# Patient Record
Sex: Female | Born: 1960 | Race: White | Hispanic: No | Marital: Married | Smoking: Former smoker
Health system: Southern US, Community
[De-identification: ages and names within clinical notes are randomized; demographics above are authoritative.]

## PROBLEM LIST (undated history)

## (undated) DIAGNOSIS — K5792 Diverticulitis of intestine, part unspecified, without perforation or abscess without bleeding: Secondary | ICD-10-CM

## (undated) DIAGNOSIS — O24419 Gestational diabetes mellitus in pregnancy, unspecified control: Secondary | ICD-10-CM

## (undated) DIAGNOSIS — I10 Essential (primary) hypertension: Secondary | ICD-10-CM

## (undated) DIAGNOSIS — E785 Hyperlipidemia, unspecified: Secondary | ICD-10-CM

## (undated) DIAGNOSIS — N200 Calculus of kidney: Secondary | ICD-10-CM

## (undated) DIAGNOSIS — B019 Varicella without complication: Secondary | ICD-10-CM

## (undated) DIAGNOSIS — K5790 Diverticulosis of intestine, part unspecified, without perforation or abscess without bleeding: Secondary | ICD-10-CM

## (undated) DIAGNOSIS — I519 Heart disease, unspecified: Secondary | ICD-10-CM

## (undated) DIAGNOSIS — K8020 Calculus of gallbladder without cholecystitis without obstruction: Secondary | ICD-10-CM

## (undated) HISTORY — DX: Heart disease, unspecified: I51.9

## (undated) HISTORY — DX: Diverticulosis of intestine, part unspecified, without perforation or abscess without bleeding: K57.90

## (undated) HISTORY — DX: Essential (primary) hypertension: I10

## (undated) HISTORY — PX: CHOLECYSTECTOMY: SHX55

## (undated) HISTORY — PX: OTHER SURGICAL HISTORY: SHX169

## (undated) HISTORY — DX: Calculus of kidney: N20.0

## (undated) HISTORY — PX: CORONARY ARTERY BYPASS GRAFT: SHX141

## (undated) HISTORY — DX: Hyperlipidemia, unspecified: E78.5

## (undated) HISTORY — DX: Varicella without complication: B01.9

## (undated) HISTORY — DX: Diverticulitis of intestine, part unspecified, without perforation or abscess without bleeding: K57.92

## (undated) HISTORY — PX: GASTRIC BYPASS: SHX52

## (undated) HISTORY — DX: Calculus of gallbladder without cholecystitis without obstruction: K80.20

## (undated) HISTORY — DX: Gestational diabetes mellitus in pregnancy, unspecified control: O24.419

---

## 2003-06-22 HISTORY — PX: GASTRIC BYPASS: SHX52

## 2017-06-21 HISTORY — PX: CORONARY ARTERY BYPASS GRAFT: SHX141

## 2018-05-10 DIAGNOSIS — I10 Essential (primary) hypertension: Secondary | ICD-10-CM | POA: Insufficient documentation

## 2018-05-10 DIAGNOSIS — D6851 Activated protein C resistance: Secondary | ICD-10-CM | POA: Insufficient documentation

## 2018-06-19 DIAGNOSIS — E785 Hyperlipidemia, unspecified: Secondary | ICD-10-CM | POA: Insufficient documentation

## 2020-02-01 LAB — HM COLONOSCOPY

## 2020-10-23 ENCOUNTER — Ambulatory Visit: Payer: 59

## 2020-10-23 ENCOUNTER — Ambulatory Visit: Payer: 59 | Attending: Internal Medicine

## 2020-10-23 NOTE — Progress Notes (Signed)
   Covid-19 Vaccination Clinic  Name:  Tricia King    MRN: [id number] DOB: 18-Sep-1960  10/23/2020  Tricia King was observed post Covid-19 immunization for 30 minutes based on pre-vaccination screening without incident. She was provided with Vaccine Information Sheet and instruction to access the V-Safe system.   Tricia King was instructed to call 911 with any severe reactions post vaccine: Marland Kitchen Difficulty breathing  . Swelling of face and throat  . A fast heartbeat  . A bad rash all over body  . Dizziness and weakness

## 2020-10-28 ENCOUNTER — Other Ambulatory Visit (HOSPITAL_BASED_OUTPATIENT_CLINIC_OR_DEPARTMENT_OTHER): Payer: Self-pay

## 2020-10-28 MED ORDER — PFIZER-BIONT COVID-19 VAC-TRIS 30 MCG/0.3ML IM SUSP
INTRAMUSCULAR | 0 refills | Status: DC
  Filled 2020-10-28: qty 0.3, 1d supply, fill #0

## 2020-11-06 ENCOUNTER — Other Ambulatory Visit (HOSPITAL_COMMUNITY): Payer: Self-pay

## 2020-11-06 ENCOUNTER — Other Ambulatory Visit: Payer: Self-pay

## 2020-11-06 ENCOUNTER — Ambulatory Visit: Payer: 59 | Admitting: Internal Medicine

## 2020-11-06 ENCOUNTER — Encounter: Payer: Self-pay | Admitting: Internal Medicine

## 2020-11-06 VITALS — BP 132/80 | HR 61 | Temp 98.4°F | Resp 18 | Ht 65.5 in | Wt 221.6 lb

## 2020-11-06 DIAGNOSIS — E785 Hyperlipidemia, unspecified: Secondary | ICD-10-CM

## 2020-11-06 DIAGNOSIS — F3341 Major depressive disorder, recurrent, in partial remission: Secondary | ICD-10-CM

## 2020-11-06 DIAGNOSIS — I25810 Atherosclerosis of coronary artery bypass graft(s) without angina pectoris: Secondary | ICD-10-CM | POA: Diagnosis not present

## 2020-11-06 DIAGNOSIS — F40243 Fear of flying: Secondary | ICD-10-CM

## 2020-11-06 DIAGNOSIS — Z9884 Bariatric surgery status: Secondary | ICD-10-CM

## 2020-11-06 DIAGNOSIS — I10 Essential (primary) hypertension: Secondary | ICD-10-CM | POA: Diagnosis not present

## 2020-11-06 DIAGNOSIS — D6851 Activated protein C resistance: Secondary | ICD-10-CM

## 2020-11-06 LAB — VITAMIN D 25 HYDROXY (VIT D DEFICIENCY, FRACTURES): VITD: 18.26 ng/mL — ABNORMAL LOW (ref 30.00–100.00)

## 2020-11-06 LAB — VITAMIN B12: Vitamin B-12: 454 pg/mL (ref 211–911)

## 2020-11-06 LAB — HEMOGLOBIN A1C: Hgb A1c MFr Bld: 6.2 % (ref 4.6–6.5)

## 2020-11-06 MED ORDER — EZETIMIBE 10 MG PO TABS
10.0000 mg | ORAL_TABLET | Freq: Every day | ORAL | 3 refills | Status: DC
  Filled 2020-11-06: qty 90, 90d supply, fill #0
  Filled 2021-01-17: qty 90, 90d supply, fill #1
  Filled 2021-05-30: qty 90, 90d supply, fill #2

## 2020-11-06 MED ORDER — ESCITALOPRAM OXALATE 10 MG PO TABS
1.0000 | ORAL_TABLET | Freq: Every day | ORAL | 3 refills | Status: DC
  Filled 2020-11-06: qty 30, 30d supply, fill #0
  Filled 2020-12-06: qty 30, 30d supply, fill #1

## 2020-11-06 MED ORDER — BUPROPION HCL ER (XL) 300 MG PO TB24
1.0000 | ORAL_TABLET | Freq: Every day | ORAL | 3 refills | Status: DC
  Filled 2020-11-06: qty 90, 90d supply, fill #0
  Filled 2021-01-17: qty 90, 90d supply, fill #1
  Filled 2021-04-24: qty 90, 90d supply, fill #2

## 2020-11-06 MED ORDER — VALACYCLOVIR HCL 1 G PO TABS
1000.0000 mg | ORAL_TABLET | ORAL | 3 refills | Status: DC | PRN
  Filled 2020-11-06: qty 90, 90d supply, fill #0

## 2020-11-06 MED ORDER — LISINOPRIL 20 MG PO TABS
40.0000 mg | ORAL_TABLET | Freq: Every day | ORAL | 3 refills | Status: DC
  Filled 2020-11-06: qty 180, 90d supply, fill #0
  Filled 2021-01-17: qty 180, 90d supply, fill #1
  Filled 2021-06-03: qty 180, 90d supply, fill #2
  Filled 2021-09-10: qty 180, 90d supply, fill #3

## 2020-11-06 MED ORDER — DIAZEPAM 5 MG PO TABS
5.0000 mg | ORAL_TABLET | Freq: Two times a day (BID) | ORAL | 0 refills | Status: DC | PRN
  Filled 2020-11-06: qty 15, 8d supply, fill #0

## 2020-11-06 MED ORDER — METOPROLOL SUCCINATE ER 50 MG PO TB24
50.0000 mg | ORAL_TABLET | Freq: Every day | ORAL | 3 refills | Status: DC
  Filled 2020-11-06: qty 90, 90d supply, fill #0
  Filled 2021-01-17: qty 90, 90d supply, fill #1

## 2020-11-06 NOTE — Progress Notes (Addendum)
Subjective:   Patient ID: Tricia King, female    DOB: Jun 19, 1961, 60 y.o.   MRN: [id number]  HPI The patient is a 60 YO female coming in for new for several concerns including blood pressure (would like to simplify regimen, taking amlodipine, lisinopril and metoprolol currently, denies headaches or chest pains, can tell when her BP is high) and weight (prior gastric bypass, has gained about 30 pounds in the last few years, not motivated to make any real changes currently, diet is not ideal, has chronic diarrhea s/p surgery, is taking multivitamin and B12 daily since this 2005) and CAD (prior CABG 2019 and needs cardiologist within [facility] system, prior stuff was done through [facility], denies chest pains, taking metoprolol and lisinopril, not on statin due to intolerance and not wanting to stop alcohol in the evening, taking zetia, her cardiologist has talked to her about injection cholesterol meds but she has not moved forward with this) and depression (ran out of wellbutrin a week or so ago and was hoping to just stop and simplify as she takes lexapro and wellbutrin, had a crying spell a few days after stopping, her daughter takes same and so she has been taking her daughter's medication, denies SI/HI, has been on these medications for many years), and fear of flying (is going on trip to [place] in near future and wants to have something to help with anxiety on the plane, does not take anti-anxiety medication long term, has taken valium in the past for something and this did fine with her).   PMH, [facility], social history reviewed and updated  Review of Systems  Constitutional: Positive for unexpected weight change.  HENT: Negative.   Eyes: Negative.   Respiratory: Negative for cough, chest tightness and shortness of breath.   Cardiovascular: Negative for chest pain, palpitations and leg swelling.  Gastrointestinal: Negative for abdominal distention, abdominal pain, constipation, diarrhea, nausea  and vomiting.  Musculoskeletal: Negative.   Skin: Negative.   Neurological: Negative.   Psychiatric/Behavioral: Positive for decreased concentration and dysphoric mood.    Objective:  Physical Exam Constitutional:      Appearance: She is well-developed. She is obese.  HENT:     Head: Normocephalic and atraumatic.  Cardiovascular:     Rate and Rhythm: Normal rate and regular rhythm.  Pulmonary:     Effort: Pulmonary effort is normal. No respiratory distress.     Breath sounds: Normal breath sounds. No wheezing or rales.  Abdominal:     General: Bowel sounds are normal. There is no distension.     Palpations: Abdomen is soft.     Tenderness: There is no abdominal tenderness. There is no rebound.  Musculoskeletal:     Cervical back: Normal range of motion.  Skin:    General: Skin is warm and dry.  Neurological:     Mental Status: She is alert and oriented to person, place, and time.     Coordination: Coordination normal.     Vitals:   11/06/20 1046  BP: 132/80  Pulse: 61  Resp: 18  Temp: 98.4 F (36.9 C)  TempSrc: Oral  SpO2: 98%  Weight: 221 lb 9.6 oz (100.5 kg)  Height: 5' 5.5" (1.664 m)    This visit occurred during the SARS-CoV-2 public health emergency.  Safety protocols were in place, including screening questions prior to the visit, additional usage of staff PPE, and extensive cleaning of exam room while observing appropriate contact time as indicated for disinfecting solutions.  Assessment & Plan:  Shingrix IM given at visit

## 2020-11-06 NOTE — Patient Instructions (Addendum)
We can stop the amlodipine and see you back in a month or so to check the blood pressure.  We will refill the wellbutrin to keep taking.  We will have you half the lexapro for 1/2 pill daily for 2 weeks. If doing well you can stop.   It is okay to use the hydrocortisone on the q-tip for the rash.

## 2020-11-07 ENCOUNTER — Encounter: Payer: Self-pay | Admitting: Internal Medicine

## 2020-11-07 ENCOUNTER — Other Ambulatory Visit (HOSPITAL_COMMUNITY): Payer: Self-pay

## 2020-11-07 DIAGNOSIS — I25810 Atherosclerosis of coronary artery bypass graft(s) without angina pectoris: Secondary | ICD-10-CM | POA: Insufficient documentation

## 2020-11-07 DIAGNOSIS — F40243 Fear of flying: Secondary | ICD-10-CM | POA: Insufficient documentation

## 2020-11-07 DIAGNOSIS — F329 Major depressive disorder, single episode, unspecified: Secondary | ICD-10-CM | POA: Insufficient documentation

## 2020-11-07 DIAGNOSIS — Z9884 Bariatric surgery status: Secondary | ICD-10-CM | POA: Insufficient documentation

## 2020-11-07 DIAGNOSIS — F419 Anxiety disorder, unspecified: Secondary | ICD-10-CM | POA: Insufficient documentation

## 2020-11-07 NOTE — Assessment & Plan Note (Signed)
Weight has increased in the last few years. She is pre-contemplative about this and not interested in making a real change at this time. Checking HgA1c today.

## 2020-11-07 NOTE — Assessment & Plan Note (Signed)
Taking zetia and reviewed recent lipid panel which is not at goal and severe hypertriglyceridemia.

## 2020-11-07 NOTE — Assessment & Plan Note (Signed)
Rx valium small amount to be used for travel only and not routinely for anxiety.

## 2020-11-07 NOTE — Assessment & Plan Note (Signed)
Will condense regimen metoprolol 25 mg BID to metoprolol xl 50 mg daily. Stop amlodipine 5 mg daily and lisinopril 40 mg daily. See her back in 1-2 months for BP check and labs. We did talk about diet and exercise helping her to be more successful at reduction of medication burden.

## 2020-11-07 NOTE — Assessment & Plan Note (Signed)
Prior lovenox with pregnancies.

## 2020-11-07 NOTE — Assessment & Plan Note (Signed)
Taking aspirin 81 mg daily. On metoprolol and lisinopril. Not on statin. Refer to cardiology as she wishes to switch care to [place] system. Notes from prior cardiologist in care everywhere and reviewed during visit.

## 2020-11-07 NOTE — Assessment & Plan Note (Signed)
Needs monitoring of B12 and vitamin D. Taking multivitamin and B12 daily.

## 2020-11-07 NOTE — Assessment & Plan Note (Signed)
We will try to condense regimen and keep wellbutrin 300 mg daily. Reduce lexapro to 5 mg daily for 2 weeks and then if doing well stop lexapro. Follow up 1-2 months. If unable to stop lexapro we can increase dosing wellbutrin and then wait 1-2 months and then try again with reduction or cessation of lexapro.

## 2020-11-13 NOTE — Addendum Note (Signed)
Addended by: Thomes Cake on: 11/13/2020 08:05 AM   Modules accepted: Orders

## 2020-12-06 ENCOUNTER — Other Ambulatory Visit (HOSPITAL_COMMUNITY): Payer: Self-pay

## 2020-12-08 ENCOUNTER — Ambulatory Visit: Payer: 59 | Attending: Internal Medicine

## 2020-12-08 DIAGNOSIS — Z20822 Contact with and (suspected) exposure to covid-19: Secondary | ICD-10-CM

## 2020-12-09 ENCOUNTER — Other Ambulatory Visit: Payer: Self-pay

## 2020-12-09 ENCOUNTER — Encounter: Payer: Self-pay | Admitting: Internal Medicine

## 2020-12-09 ENCOUNTER — Other Ambulatory Visit (HOSPITAL_COMMUNITY): Payer: Self-pay

## 2020-12-09 ENCOUNTER — Ambulatory Visit: Payer: 59 | Admitting: Internal Medicine

## 2020-12-09 VITALS — BP 120/80 | HR 65 | Temp 98.5°F | Resp 18 | Ht 65.5 in | Wt 218.0 lb

## 2020-12-09 DIAGNOSIS — I10 Essential (primary) hypertension: Secondary | ICD-10-CM | POA: Diagnosis not present

## 2020-12-09 DIAGNOSIS — F3341 Major depressive disorder, recurrent, in partial remission: Secondary | ICD-10-CM

## 2020-12-09 LAB — COMPREHENSIVE METABOLIC PANEL
ALT: 23 U/L (ref 0–35)
AST: 18 U/L (ref 0–37)
Albumin: 3.8 g/dL (ref 3.5–5.2)
Alkaline Phosphatase: 96 U/L (ref 39–117)
BUN: 15 mg/dL (ref 6–23)
CO2: 25 mEq/L (ref 19–32)
Calcium: 8.9 mg/dL (ref 8.4–10.5)
Chloride: 101 mEq/L (ref 96–112)
Creatinine, Ser: 0.71 mg/dL (ref 0.40–1.20)
GFR: 93.81 mL/min (ref >60.00)
Glucose, Bld: 112 mg/dL — ABNORMAL HIGH (ref 70–99)
Potassium: 4 mEq/L (ref 3.5–5.1)
Sodium: 136 mEq/L (ref 135–145)
Total Bilirubin: 0.6 mg/dL (ref 0.2–1.2)
Total Protein: 6.8 g/dL (ref 6.0–8.3)

## 2020-12-09 LAB — SARS-COV-2, NAA 2 DAY TAT

## 2020-12-09 LAB — NOVEL CORONAVIRUS, NAA: SARS-CoV-2, NAA: NOT DETECTED

## 2020-12-09 MED ORDER — ESCITALOPRAM OXALATE 10 MG PO TABS
1.0000 | ORAL_TABLET | Freq: Every day | ORAL | 3 refills | Status: DC
Start: 2020-12-09 — End: 2022-01-26
  Filled 2021-01-17: qty 90, 90d supply, fill #0
  Filled 2021-04-21: qty 90, 90d supply, fill #1
  Filled 2021-08-12: qty 90, 90d supply, fill #2

## 2020-12-09 NOTE — Assessment & Plan Note (Signed)
BP at goal on metoprolol 50 mg daily, lisinopril 40 mg daily. Checking CMP today due to increase in ACE-I.

## 2020-12-09 NOTE — Patient Instructions (Signed)
We will check the kidney function.

## 2020-12-09 NOTE — Assessment & Plan Note (Signed)
Controlled on lexapro 10 mg daily and wellbutrin 300 mg daily. She was unable to reduce or eliminate lexapro and would like to continue on both. Lexapro was refilled for 90 day script for convenience.

## 2020-12-09 NOTE — Progress Notes (Signed)
   Subjective:   Patient ID: Tricia King, female    DOB: 26-Jan-1961, 60 y.o.   MRN: [id number]  HPI The patient is a 60 YO female coming in for follow up mood and blood pressure. We did have her try to wean off lexapro since last visit which did not go well. She did get some moodiness about a week into taper so went back to her normal 10 mg daily dosing and kept wellbutrin 300 mg daily as well. She is satisfied with mood currently. She also stopped amlodipine and we did change metoprolol to once daily dosing and increased lisinopril to 40 mg daily. She is doing well with those changes.   Review of Systems  Constitutional: Negative.   HENT: Negative.    Eyes: Negative.   Respiratory:  Negative for cough, chest tightness and shortness of breath.   Cardiovascular:  Negative for chest pain, palpitations and leg swelling.  Gastrointestinal:  Negative for abdominal distention, abdominal pain, constipation, diarrhea, nausea and vomiting.  Musculoskeletal: Negative.   Skin: Negative.   Neurological: Negative.   Psychiatric/Behavioral: Negative.     Objective:  Physical Exam Constitutional:      Appearance: She is well-developed.  HENT:     Head: Normocephalic and atraumatic.  Cardiovascular:     Rate and Rhythm: Normal rate and regular rhythm.  Pulmonary:     Effort: Pulmonary effort is normal. No respiratory distress.     Breath sounds: Normal breath sounds. No wheezing or rales.  Abdominal:     General: Bowel sounds are normal. There is no distension.     Palpations: Abdomen is soft.     Tenderness: There is no abdominal tenderness. There is no rebound.  Musculoskeletal:     Cervical back: Normal range of motion.  Skin:    General: Skin is warm and dry.  Neurological:     Mental Status: She is alert and oriented to person, place, and time.     Coordination: Coordination normal.    Vitals:   12/09/20 0829  BP: 120/80  Pulse: 65  Resp: 18  Temp: 98.5 F (36.9 C)  TempSrc:  Oral  SpO2: 97%  Weight: 218 lb (98.9 kg)  Height: 5' 5.5" (1.664 m)    This visit occurred during the SARS-CoV-2 public health emergency.  Safety protocols were in place, including screening questions prior to the visit, additional usage of staff PPE, and extensive cleaning of exam room while observing appropriate contact time as indicated for disinfecting solutions.   Assessment & Plan:

## 2020-12-11 ENCOUNTER — Other Ambulatory Visit (HOSPITAL_COMMUNITY): Payer: Self-pay

## 2020-12-31 ENCOUNTER — Encounter: Payer: Self-pay | Admitting: Internal Medicine

## 2021-01-17 ENCOUNTER — Other Ambulatory Visit (HOSPITAL_COMMUNITY): Payer: Self-pay

## 2021-01-19 ENCOUNTER — Other Ambulatory Visit (HOSPITAL_COMMUNITY): Payer: Self-pay

## 2021-02-27 ENCOUNTER — Other Ambulatory Visit (HOSPITAL_COMMUNITY): Payer: Self-pay

## 2021-03-12 ENCOUNTER — Ambulatory Visit (INDEPENDENT_AMBULATORY_CARE_PROVIDER_SITE_OTHER): Payer: 59

## 2021-03-12 ENCOUNTER — Other Ambulatory Visit: Payer: Self-pay

## 2021-03-12 DIAGNOSIS — Z23 Encounter for immunization: Secondary | ICD-10-CM | POA: Diagnosis not present

## 2021-04-11 ENCOUNTER — Emergency Department (HOSPITAL_COMMUNITY)
Admission: EM | Admit: 2021-04-11 | Discharge: 2021-04-11 | Discharge status: Against Medical Advice | Payer: 59 | Attending: Emergency Medicine | Admitting: Emergency Medicine

## 2021-04-11 ENCOUNTER — Emergency Department (HOSPITAL_COMMUNITY): Payer: 59

## 2021-04-11 ENCOUNTER — Encounter (HOSPITAL_COMMUNITY): Payer: Self-pay | Admitting: Emergency Medicine

## 2021-04-11 ENCOUNTER — Other Ambulatory Visit: Payer: Self-pay

## 2021-04-11 DIAGNOSIS — M25512 Pain in left shoulder: Secondary | ICD-10-CM | POA: Insufficient documentation

## 2021-04-11 DIAGNOSIS — I2510 Atherosclerotic heart disease of native coronary artery without angina pectoris: Secondary | ICD-10-CM | POA: Insufficient documentation

## 2021-04-11 DIAGNOSIS — R079 Chest pain, unspecified: Secondary | ICD-10-CM | POA: Insufficient documentation

## 2021-04-11 DIAGNOSIS — N9489 Other specified conditions associated with female genital organs and menstrual cycle: Secondary | ICD-10-CM | POA: Diagnosis not present

## 2021-04-11 DIAGNOSIS — Z7982 Long term (current) use of aspirin: Secondary | ICD-10-CM | POA: Diagnosis not present

## 2021-04-11 DIAGNOSIS — Z955 Presence of coronary angioplasty implant and graft: Secondary | ICD-10-CM | POA: Insufficient documentation

## 2021-04-11 DIAGNOSIS — R0602 Shortness of breath: Secondary | ICD-10-CM | POA: Diagnosis not present

## 2021-04-11 DIAGNOSIS — R0789 Other chest pain: Secondary | ICD-10-CM | POA: Diagnosis not present

## 2021-04-11 DIAGNOSIS — Z79899 Other long term (current) drug therapy: Secondary | ICD-10-CM | POA: Insufficient documentation

## 2021-04-11 DIAGNOSIS — Z87891 Personal history of nicotine dependence: Secondary | ICD-10-CM | POA: Insufficient documentation

## 2021-04-11 DIAGNOSIS — I10 Essential (primary) hypertension: Secondary | ICD-10-CM | POA: Diagnosis not present

## 2021-04-11 DIAGNOSIS — M79602 Pain in left arm: Secondary | ICD-10-CM | POA: Diagnosis not present

## 2021-04-11 LAB — TROPONIN I (HIGH SENSITIVITY)
Troponin I (High Sensitivity): 4 ng/L (ref <18)
Troponin I (High Sensitivity): 4 ng/L (ref <18)

## 2021-04-11 LAB — CBC
HCT: 43.5 % (ref 36.0–46.0)
Hemoglobin: 13.8 g/dL (ref 12.0–15.0)
MCH: 28.8 pg (ref 26.0–34.0)
MCHC: 31.7 g/dL (ref 30.0–36.0)
MCV: 90.8 fL (ref 80.0–100.0)
Platelets: 221 10*3/uL (ref 150–400)
RBC: 4.79 MIL/uL (ref 3.87–5.11)
RDW: 13.2 % (ref 11.5–15.5)
WBC: 8.1 10*3/uL (ref 4.0–10.5)
nRBC: 0 % (ref 0.0–0.2)

## 2021-04-11 LAB — I-STAT BETA HCG BLOOD, ED (MC, WL, AP ONLY): I-stat hCG, quantitative: 5 m[IU]/mL — ABNORMAL HIGH (ref <5)

## 2021-04-11 LAB — BASIC METABOLIC PANEL
Anion gap: 10 (ref 5–15)
BUN: 14 mg/dL (ref 6–20)
CO2: 24 mmol/L (ref 22–32)
Calcium: 8.9 mg/dL (ref 8.9–10.3)
Chloride: 103 mmol/L (ref 98–111)
Creatinine, Ser: 0.87 mg/dL (ref 0.44–1.00)
GFR, Estimated: >60 mL/min (ref >60)
Glucose, Bld: 135 mg/dL — ABNORMAL HIGH (ref 70–99)
Potassium: 3.6 mmol/L (ref 3.5–5.1)
Sodium: 137 mmol/L (ref 135–145)

## 2021-04-11 LAB — HCG, QUANTITATIVE, PREGNANCY: hCG, Beta Chain, Quant, S: 7 m[IU]/mL — ABNORMAL HIGH (ref <5)

## 2021-04-11 LAB — PROTIME-INR
INR: 1 (ref 0.8–1.2)
Prothrombin Time: 13.3 seconds (ref 11.4–15.2)

## 2021-04-11 NOTE — ED Provider Notes (Signed)
[name] EMERGENCY DEPARTMENT Provider Note   CSN: [id number] Arrival date & time: 04/11/21  0104     History Chief Complaint  Patient presents with   Shoulder/Arm Pain ( Hx. CAD/CABG)    Tricia King is a 60 y.o. female with PMHx HTN, HLD, CAD s/p MI CABG, and factor 5 leiden (currently on baby aspirin for same) who presents to the ED Today with complaint of left chest/shoulder pain radiating to left jaw intermittently for the past week. Pt also complains of mild SOB with same. No nausea, vomiting, diaphoresis. She reports similar symptoms in 2019 when she had an MI. She admits that she was seeing a cardiologist in the past however after insurance changes she never re-established care with a cardiologist in her network. She has no other complaints at this time. She is a former smoker. Reports extensive family history of CAD.   The history is provided by the patient and medical records.      Past Medical History:  Diagnosis Date   Chicken pox    Diverticulitis    Gestational diabetes    Heart disease    Hyperlipidemia    Hypertension     Patient Active Problem List   Diagnosis Date Noted   CAD (coronary artery disease) of bypass graft 11/07/2020   History of gastric bypass 11/07/2020   MDD (major depressive disorder) 11/07/2020   Anxiety with flying 11/07/2020   Dyslipidemia 06/19/2018   Morbid obesity ([place]) 05/15/2018   Essential hypertension 05/10/2018   Factor 5 Leiden mutation, heterozygous ([place]) 05/10/2018    Past Surgical History:  Procedure Laterality Date   CESAREAN SECTION     CHOLECYSTECTOMY     CORONARY ARTERY BYPASS GRAFT     GASTRIC BYPASS     Lasix Eye       OB History   No obstetric history on file.     No family history on file.  Social History   Tobacco Use   Smoking status: Former   Smokeless tobacco: Never  Substance Use Topics   Alcohol use: Yes   Drug use: Never    Home Medications Prior to Admission  medications   Medication Sig Start Date End Date Taking? Authorizing Provider  amLODipine (NORVASC) 5 MG tablet Take 5 mg by mouth daily as needed (for blood pressure 150).   Yes [provider]  aspirin 81 MG EC tablet Take 81 mg by mouth daily.   Yes [provider]  buPROPion (WELLBUTRIN XL) 300 MG 24 hr tablet Take 1 tablet (300 mg total) by mouth daily. 11/06/20  Yes Hoyt Koch, MD  co-enzyme Q-10 30 MG capsule Take 100 mg by mouth daily.   Yes [provider]  diazepam (VALIUM) 5 MG tablet Take 1 tablet (5 mg total) by mouth every 12 (twelve) hours as needed for anxiety. 11/06/20  Yes Hoyt Koch, MD  DM-Doxylamine-Acetaminophen (NYQUIL COLD & FLU PO) Take 2 capsules by mouth at bedtime as needed (cold/cough).   Yes [provider]  DM-Phenylephrine-Acetaminophen (VICKS DAYQUIL COLD & FLU PO) Take 2 capsules by mouth 2 (two) times daily as needed (cold/cough).   Yes [provider]  escitalopram (LEXAPRO) 10 MG tablet Take 1 tablet (10 mg total) by mouth daily. 12/09/20  Yes Hoyt Koch, MD  ezetimibe (ZETIA) 10 MG tablet Take 1 tablet (10 mg total) by mouth daily. 11/06/20  Yes Hoyt Koch, MD  ferrous sulfate 325 (65 FE) MG EC tablet  Take 1 tablet by mouth daily with breakfast. 06/16/18  Yes [provider]  ibuprofen (ADVIL) 200 MG tablet Take 600 mg by mouth every 6 (six) hours as needed for headache or moderate pain.   Yes [provider]  Javier Docker Oil 500 MG CAPS Take 500 mg by mouth daily.   Yes [provider]  lisinopril (ZESTRIL) 20 MG tablet Take 2 tablets (40 mg total) by mouth daily. 11/06/20  Yes Hoyt Koch, MD  loratadine (CLARITIN) 10 MG tablet Take 10 mg by mouth daily.   Yes [provider]  metoprolol succinate (TOPROL-XL) 50 MG 24 hr tablet Take 1 tablet (50 mg total) by mouth daily. Take with or immediately following a meal. 11/06/20  Yes Hoyt Koch, MD  valACYclovir (VALTREX) 1000 MG tablet Take 1 tablet (1,000 mg total) by mouth as needed as directed Patient taking differently: Take 1,000 mg by mouth as needed (outbreaks). 11/06/20  Yes Hoyt Koch, MD  Vitamin D3 (VITAMIN D) 25 MCG tablet Take 1,000 Units by mouth daily.   Yes [provider]    Allergies    Enoxaparin, Heparin (porcine), Other, Sulfa antibiotics, and Sulfamethoxazole  Review of Systems   Review of Systems  Constitutional:  Negative for chills, diaphoresis and fever.  Respiratory:  Positive for shortness of breath. Negative for cough.   Cardiovascular:  Positive for chest pain. Negative for palpitations and leg swelling.  Gastrointestinal:  Negative for nausea and vomiting.  Musculoskeletal:  Positive for arthralgias.  All other systems reviewed and are negative.  Physical Exam Updated Vital Signs BP (!) 148/78   Pulse (!) 52   Temp 98.1 F (36.7 C) (Oral)   Resp 20   Ht 5\' 6"  (1.676 m)   Wt 110 kg   SpO2 99%   BMI 39.14 kg/m   Physical Exam Vitals and nursing note reviewed.  Constitutional:      Appearance: She is not ill-appearing or diaphoretic.  HENT:     Head: Normocephalic and atraumatic.  Eyes:     Conjunctiva/sclera: Conjunctivae normal.  Cardiovascular:     Rate and Rhythm: Normal rate and regular rhythm.     Pulses: Normal pulses.  Pulmonary:     Effort: Pulmonary effort is normal.     Breath sounds: Normal breath sounds. No wheezing, rhonchi or rales.  Abdominal:     Palpations: Abdomen is soft.     Tenderness: There is no abdominal tenderness.  Musculoskeletal:     Cervical back: Neck supple.  Skin:    General: Skin is warm and dry.  Neurological:     Mental Status: She is alert.    ED Results / Procedures / Treatments   Labs (all labs ordered are listed, but only abnormal results are displayed) Labs Reviewed  BASIC METABOLIC PANEL - Abnormal; Notable for the following components:       Result Value   Glucose, Bld 135 (*)    All other components within normal limits  HCG, QUANTITATIVE, PREGNANCY - Abnormal; Notable for the following components:   hCG, Beta Chain, Quant, S 7 (*)    All other components within normal limits  I-STAT BETA HCG BLOOD, ED ([facility], [facility], [facility] ONLY) - Abnormal; Notable for the following components:   I-stat hCG, quantitative 5.0 (*)    All other components within normal limits  CBC  PROTIME-INR  PREGNANCY, URINE  TROPONIN I (HIGH SENSITIVITY)  TROPONIN I (HIGH SENSITIVITY)    EKG EKG Interpretation  Date/Time:  Saturday April 11 2021 01:13:46 EDT Ventricular Rate:  66 PR Interval:  204 QRS Duration: 90 QT Interval:  418 QTC Calculation: 438 R Axis:   104 Text Interpretation: Normal sinus rhythm Rightward axis Anterior infarct , age undetermined Abnormal ECG No old tracing to compare Confirmed by Malvin Johns [phone]) on 04/11/2021 7:05:29 AM  Radiology DG Chest 2 View  Result Date: 04/11/2021 CLINICAL DATA:  Left upper extremity pain. EXAM: CHEST - 2 VIEW COMPARISON:  Chest radiograph dated 08/30/2018. FINDINGS: No focal consolidation, pleural effusion, or pneumothorax. The cardiac silhouette is within limits. Median sternotomy wires and CABG vascular clips. No acute osseous pathology. IMPRESSION: No active cardiopulmonary disease. Electronically Signed   By: Anner Crete M.D.   On: 04/11/2021 01:45    Procedures Procedures   Medications Ordered in ED Medications - No data to display  ED Course  I have reviewed the triage vital signs and the nursing notes.  Pertinent labs & imaging results that were available during my care of the patient were reviewed by me and considered in my medical decision making (see chart for details).    MDM Rules/Calculators/A&P                           60 year old female who presents to the ED today with complaint of intermittent left chest/shoulder pain radiating into left jaw for the past week  with associated shortness of breath.  History of MI with similar symptoms in 2019.  On arrival to the ED today patient's blood pressure significantly elevated at 193/103.  Has normalized while in the ED today with most recent blood pressure reading 155/82.  Remainder vitals unremarkable.  EKG with anterior infarct, age indeterminate.  No old tracing to compare to unfortunately.  Her chest x-ray is clear.  Her troponins are flat, both of 4.  She did have a beta-hCG drawn which has returned slightly elevated at 5.0.  Given she is 41 I have lower suspicion for her being pregnant today however will get a hCG quant for definitive rule out.  Remainder of labs unremarkable.  Per chart review patient does have a history of factor V Leiden.  She most recently saw cardiology in February prior to changing her insurance and there is mention of her being on Eliquis.  Patient tells me that she was taken off of Eliquis shortly after her CABG in 2019 however does not seem consistent with cardiology notes.  Given history of factor V Leiden and not currently anticoagulated we will plan for CTA.  We will also plan to consult cardiology as she currently does not have a cardiologist.  Question if she will need admission for cardiac work-up versus close outpatient follow-up.   Pt declining CTA at this time. We had lengthy discussion regarding risk factors including undiagnosed PE. She understands the risks involved including death and continues to decline CTA. She wants to go home at this time. Still awaiting cards consult.   Quant also elevated at 7. Have attempted to obtain urine from pt however she did not want to urinate for [place].   Pt has ultimately decided to sign out [place]. Workup delayed due to waiting on call from cardiology. After three unsuccessful pages pt decided she did not want to wait any longer and signed out. She understands the risks involved.   This note was prepared using Dragon voice  recognition software and may include unintentional dictation errors due  to the inherent limitations of voice recognition software.  Final Clinical Impression(s) / ED Diagnoses Final diagnoses:  Chest pain, unspecified type    Rx / DC Orders ED Discharge Orders     None        Eustaquio Maize, PA-C 04/11/21 0932    Malvin Johns, MD 04/11/21 1526

## 2021-04-11 NOTE — ED Notes (Signed)
Pt leaving AMA. Pt refusing vitals prior to leaving.

## 2021-04-11 NOTE — ED Notes (Signed)
Pt requested a muscle relaxer for arm spasms. Provider made aware and will not order due to possible cardiac nature. Explained this to pt and pt will proceed with AMA paperwork.

## 2021-04-11 NOTE — ED Triage Notes (Signed)
Patient reports intermittent left shoulder/left arm pain radiating to left jaw this week , no SOB , denies emesis or diaphoresis , no chest pain , history CAD/CABG.

## 2021-04-11 NOTE — ED Notes (Signed)
Patient states she is unable to provide a urine sample at this time and would like to be discharged. Provider made aware.

## 2021-04-22 ENCOUNTER — Other Ambulatory Visit (HOSPITAL_COMMUNITY): Payer: Self-pay

## 2021-04-24 ENCOUNTER — Other Ambulatory Visit (HOSPITAL_COMMUNITY): Payer: Self-pay

## 2021-05-22 ENCOUNTER — Encounter: Payer: Self-pay | Admitting: Internal Medicine

## 2021-05-29 ENCOUNTER — Other Ambulatory Visit (HOSPITAL_COMMUNITY): Payer: Self-pay

## 2021-05-29 ENCOUNTER — Other Ambulatory Visit: Payer: Self-pay

## 2021-05-29 ENCOUNTER — Ambulatory Visit: Payer: 59 | Admitting: Internal Medicine

## 2021-05-29 ENCOUNTER — Encounter: Payer: Self-pay | Admitting: Internal Medicine

## 2021-05-29 VITALS — BP 126/80 | HR 54 | Resp 18 | Ht 66.0 in | Wt 215.4 lb

## 2021-05-29 DIAGNOSIS — I25810 Atherosclerosis of coronary artery bypass graft(s) without angina pectoris: Secondary | ICD-10-CM | POA: Diagnosis not present

## 2021-05-29 DIAGNOSIS — J3489 Other specified disorders of nose and nasal sinuses: Secondary | ICD-10-CM

## 2021-05-29 DIAGNOSIS — Z1231 Encounter for screening mammogram for malignant neoplasm of breast: Secondary | ICD-10-CM

## 2021-05-29 DIAGNOSIS — J309 Allergic rhinitis, unspecified: Secondary | ICD-10-CM | POA: Insufficient documentation

## 2021-05-29 MED ORDER — MUPIROCIN CALCIUM 2 % EX CREA
1.0000 | TOPICAL_CREAM | Freq: Two times a day (BID) | CUTANEOUS | 0 refills | Status: DC
Start: 2021-05-29 — End: 2023-04-20
  Filled 2021-05-29: qty 15, 10d supply, fill #0

## 2021-05-29 MED ORDER — METOPROLOL TARTRATE 25 MG PO TABS
25.0000 mg | ORAL_TABLET | Freq: Two times a day (BID) | ORAL | 3 refills | Status: DC
  Filled 2021-05-29: qty 180, 90d supply, fill #0
  Filled 2022-04-23: qty 180, 90d supply, fill #1

## 2021-05-29 NOTE — Assessment & Plan Note (Signed)
Does not need to remove piercing. Rx bactroban to use topically to help resolve. Continue care for the piercing and heat. Call if worsening.

## 2021-05-29 NOTE — Progress Notes (Signed)
   Subjective:   Patient ID: Tricia King, female    DOB: 07-17-1960, 60 y.o.   MRN: [id number]  HPI The patient is a 60 YO female coming in for nostril problem. Recent piercing.   Review of Systems  Constitutional: Negative.   HENT: Negative.         Nose pain  Eyes: Negative.   Respiratory:  Negative for cough, chest tightness and shortness of breath.   Cardiovascular:  Negative for chest pain, palpitations and leg swelling.  Gastrointestinal:  Negative for abdominal distention, abdominal pain, constipation, diarrhea, nausea and vomiting.  Musculoskeletal: Negative.   Skin: Negative.   Neurological: Negative.   Psychiatric/Behavioral: Negative.     Objective:  Physical Exam Constitutional:      Appearance: She is well-developed.  HENT:     Head: Normocephalic and atraumatic.     Nose:     Comments: Left nose piercing with some redness, no purulent drainage and minimally tender to touch. Cardiovascular:     Rate and Rhythm: Normal rate and regular rhythm.  Pulmonary:     Effort: Pulmonary effort is normal. No respiratory distress.     Breath sounds: Normal breath sounds. No wheezing or rales.  Abdominal:     General: Bowel sounds are normal. There is no distension.     Palpations: Abdomen is soft.     Tenderness: There is no abdominal tenderness. There is no rebound.  Musculoskeletal:     Cervical back: Normal range of motion.  Skin:    General: Skin is warm and dry.  Neurological:     Mental Status: She is alert and oriented to person, place, and time.     Coordination: Coordination normal.    Vitals:   05/29/21 0943  BP: 126/80  Pulse: (!) 54  Resp: 18  SpO2: 98%  Weight: 215 lb 6.4 oz (97.7 kg)  Height: 5\' 6"  (1.676 m)    This visit occurred during the SARS-CoV-2 public health emergency.  Safety protocols were in place, including screening questions prior to the visit, additional usage of staff PPE, and extensive cleaning of exam room while observing  appropriate contact time as indicated for disinfecting solutions.   Assessment & Plan:

## 2021-05-29 NOTE — Assessment & Plan Note (Signed)
Would like to have a local cardiologist referral placed.

## 2021-05-29 NOTE — Patient Instructions (Addendum)
We have sent in bactroban ointment to use twice a day for the nose.   The book Come as you are might help

## 2021-05-30 ENCOUNTER — Other Ambulatory Visit (HOSPITAL_COMMUNITY): Payer: Self-pay

## 2021-05-30 ENCOUNTER — Other Ambulatory Visit: Payer: Self-pay | Admitting: Internal Medicine

## 2021-06-01 ENCOUNTER — Other Ambulatory Visit (HOSPITAL_COMMUNITY): Payer: Self-pay

## 2021-06-01 MED ORDER — DIAZEPAM 5 MG PO TABS
5.0000 mg | ORAL_TABLET | Freq: Two times a day (BID) | ORAL | 0 refills | Status: DC | PRN
  Filled 2021-06-01: qty 15, 7d supply, fill #0

## 2021-06-02 ENCOUNTER — Encounter: Payer: Self-pay | Admitting: Internal Medicine

## 2021-06-04 ENCOUNTER — Other Ambulatory Visit (HOSPITAL_COMMUNITY): Payer: Self-pay

## 2021-06-12 ENCOUNTER — Ambulatory Visit: Payer: 59 | Admitting: Internal Medicine

## 2021-07-31 ENCOUNTER — Other Ambulatory Visit: Payer: Self-pay

## 2021-07-31 ENCOUNTER — Other Ambulatory Visit (HOSPITAL_COMMUNITY): Payer: Self-pay

## 2021-07-31 ENCOUNTER — Encounter: Payer: Self-pay | Admitting: Internal Medicine

## 2021-07-31 ENCOUNTER — Encounter: Payer: Self-pay | Admitting: *Deleted

## 2021-07-31 ENCOUNTER — Ambulatory Visit (INDEPENDENT_AMBULATORY_CARE_PROVIDER_SITE_OTHER): Payer: 59 | Admitting: Internal Medicine

## 2021-07-31 VITALS — BP 136/80 | HR 62 | Ht 66.0 in | Wt 215.0 lb

## 2021-07-31 DIAGNOSIS — E785 Hyperlipidemia, unspecified: Secondary | ICD-10-CM | POA: Diagnosis not present

## 2021-07-31 DIAGNOSIS — I25708 Atherosclerosis of coronary artery bypass graft(s), unspecified, with other forms of angina pectoris: Secondary | ICD-10-CM | POA: Diagnosis not present

## 2021-07-31 DIAGNOSIS — I10 Essential (primary) hypertension: Secondary | ICD-10-CM

## 2021-07-31 DIAGNOSIS — D6851 Activated protein C resistance: Secondary | ICD-10-CM

## 2021-07-31 MED ORDER — ATORVASTATIN CALCIUM 40 MG PO TABS
40.0000 mg | ORAL_TABLET | Freq: Every day | ORAL | 3 refills | Status: DC
  Filled 2021-07-31: qty 90, 90d supply, fill #0
  Filled 2021-10-11: qty 90, 90d supply, fill #1
  Filled 2022-01-26: qty 90, 90d supply, fill #2

## 2021-07-31 MED ORDER — ISOSORBIDE MONONITRATE ER 30 MG PO TB24
30.0000 mg | ORAL_TABLET | Freq: Every day | ORAL | 3 refills | Status: DC
  Filled 2021-07-31: qty 90, 90d supply, fill #0

## 2021-07-31 MED ORDER — PANTOPRAZOLE SODIUM 40 MG PO TBEC
40.0000 mg | DELAYED_RELEASE_TABLET | Freq: Every day | ORAL | 3 refills | Status: DC
  Filled 2021-07-31: qty 90, 90d supply, fill #0

## 2021-07-31 MED ORDER — AMLODIPINE BESYLATE 5 MG PO TABS
5.0000 mg | ORAL_TABLET | Freq: Every day | ORAL | 3 refills | Status: DC
  Filled 2021-07-31: qty 90, 90d supply, fill #0
  Filled 2021-10-11: qty 90, 90d supply, fill #1
  Filled 2022-01-26: qty 90, 90d supply, fill #2
  Filled 2022-06-03: qty 90, 90d supply, fill #3

## 2021-07-31 MED ORDER — NITROGLYCERIN 0.4 MG SL SUBL
0.4000 mg | SUBLINGUAL_TABLET | SUBLINGUAL | 6 refills | Status: DC | PRN
  Filled 2021-07-31: qty 25, 10d supply, fill #0

## 2021-07-31 NOTE — Patient Instructions (Signed)
Medication Instructions:  Your physician has recommended you make the following change in your medication: Start amlodipine 5 mg by mouth daily. Start Isosorbide mononitrate 30 mg by mouth daily at bedtime. Start Pantoprazole 40 mg by mouth daily. Start Atorvastatin 40 mg by mouth daily. Stop Zetia A prescription for nitroglycerin to use as needed has been sent to your pharmacy  *If you need a refill on your cardiac medications before your next appointment, please call your pharmacy*   Lab Work: Your physician recommends that you return for lab work in: 6 weeks.  Lipid and liver profiles.  This will be fasting  If you have labs (blood work) drawn today and your tests are completely normal, you will receive your results only by: [place] (if you have MyChart) OR A paper copy in the mail If you have any lab test that is abnormal or we need to change your treatment, we will call you to review the results.   Testing/Procedures: Your physician has requested that you have an echocardiogram. Echocardiography is a painless test that uses sound waves to create images of your heart. It provides your doctor with information about the size and shape of your heart and how well your hearts chambers and valves are working. This procedure takes approximately one hour. There are no restrictions for this procedure.  Your physician has requested that you have a lexiscan myoview. For further information please visit [email or web address]. Please follow instruction sheet, as given.    Follow-Up: At [facility], you and your health needs are our priority.  As part of our continuing mission to provide you with exceptional heart care, we have created designated Provider Care Teams.  These Care Teams include your primary Cardiologist (physician) and Advanced Practice Providers (APPs -  Physician Assistants and Nurse Practitioners) who all work together to provide you with the care you need, when you need  it.  We recommend signing up for the patient portal called "MyChart".  Sign up information is provided on this After Visit Summary.  MyChart is used to connect with patients for Virtual Visits (Telemedicine).  Patients are able to view lab/test results, encounter notes, upcoming appointments, etc.  Non-urgent messages can be sent to your provider as well.   To learn more about what you can do with MyChart, go to [email or web address].    Your next appointment:   3 month(s)  The format for your next appointment:   In Person  Provider:   Early Osmond, MD     Other Instructions

## 2021-07-31 NOTE — Progress Notes (Signed)
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  Continue aspirin, beta-blocker, and will start Imdur 30 mg at bedtime and as needed nitroglycerin.  Her chest pain symptoms after eating strike me as a GI based etiology especially with her history of gastric bypass.  We will start pantoprazole 40 mg as well.  I will obtain a Lexiscan stress test and echocardiogram.  I will see the patient back in 3 months or earlier if needed.  2.  Blood pressure is elevated above goal we will have the patient restart amlodipine 5 mg on a daily basis.  3.  Start atorvastatin 40 mg.  Lipids from last year demonstrated total cholesterol of 211, triglycerides 546, HDL 32, and LDL could not be calculated.  Check lipid panel and LFTs in 6 weeks.  4.  Per external hematology recommendations, the patient does not require chronic anticoagulation due to heterozygosity.         Shared Decision Making/Informed Consent The risks [chest pain, shortness of breath, cardiac arrhythmias, dizziness, blood pressure fluctuations, myocardial infarction, stroke/transient ischemic attack, nausea, vomiting, allergic reaction, radiation exposure, metallic taste sensation and life-threatening complications (estimated to be 1 in 10,000)], benefits (risk stratification, diagnosing coronary artery disease, treatment guidance) and alternatives of a nuclear stress test were discussed in detail with  Ms. Tricia King and she agrees to proceed.   Dispo:  No follow-ups on file.     Medication Adjustments/Labs and Tests Ordered: Current medicines are reviewed at length with the patient today.  Concerns regarding medicines are outlined above.   Tests Ordered: Orders Placed This Encounter  Procedures   EKG 12-Lead    Medication Changes: No orders of the defined types were placed in this encounter.   History of Present Illness:    FOCUSED CARDIOVASCULAR PROBLEM LIST:   1.  Coronary artery disease status post CABG consisting of a LIMA to LAD, left radial to obtuse marginal, and vein graft to PDA 2019 2.  Hypertension 3.  Hyperlipidemia with statin intolerance 4.  Factor V Leiden mutation (heterozygous)   The patient is a 61 y.o. female with the indicated medical history here to establish cardiovascular care.  Per care everywhere notes the patient had been followed [place].  Per her last office visit in 2021 she is having mild angina and through shared decision making medical therapy was pursued.  She presented to the ED in October 2022 for chest pain but left AMA.  The patient tells me that she does get chest pain at times.  This typically happens after she eats and lies down.  She actually took a walk yesterday and had no chest pain.  She has had some mild shortness of breath with exertion.  She denies any presyncope, syncope, palpitations, paroxysmal nocturnal dyspnea, orthopnea.  She generally feels a little bit off since quitting alcohol completely.  She does take her medications but she only takes amlodipine on a as  needed basis and is noticed that her blood pressures have been highly elevated at home.        Previous Medical History: Past Medical History:  Diagnosis Date   Chicken pox    Diverticulitis    Gestational diabetes    Heart disease    Hyperlipidemia    Hypertension      Current Medications: Current Meds  Medication Sig   amLODipine  (NORVASC) 5 MG tablet Take 5 mg by mouth daily as needed (for blood pressure 150).   aspirin 81 MG EC tablet Take 81 mg by mouth daily.   co-enzyme Q-10 30 MG capsule Take 100 mg by mouth daily.   diazepam (VALIUM) 5 MG tablet Take 1 tablet (5 mg total) by mouth every 12 (twelve) hours as needed for anxiety.   escitalopram (LEXAPRO) 10 MG tablet Take 1 tablet (10 mg total) by mouth daily.   ezetimibe (ZETIA) 10 MG tablet Take 1 tablet (10 mg total) by mouth daily.   ferrous sulfate 325 (65 FE) MG EC tablet Take 1 tablet by mouth daily with breakfast.   ibuprofen (ADVIL) 200 MG tablet Take 600 mg by mouth every 6 (six) hours as needed for headache or moderate pain.   Krill Oil 500 MG CAPS Take 500 mg by mouth daily.   lisinopril (ZESTRIL) 20 MG tablet Take 2 tablets (40 mg total) by mouth daily.   loratadine (CLARITIN) 10 MG tablet Take 10 mg by mouth daily.   metoprolol tartrate (LOPRESSOR) 25 MG tablet Take 1 tablet (25 mg total) by mouth 2 (two) times daily.   mupirocin cream (BACTROBAN) 2 % Apply 1 application topically 2 (two) times daily.   valACYclovir (VALTREX) 1000 MG tablet Take 1 tablet (1,000 mg total) by mouth as needed as directed (Patient taking differently: Take 1,000 mg by mouth as needed (outbreaks).)   Vitamin D3 (VITAMIN D) 25 MCG tablet Take 1,000 Units by mouth daily.     Allergies:    Enoxaparin, Heparin (porcine), Other, Sulfa antibiotics, and Sulfamethoxazole   Social History:   Social History   Tobacco Use   Smoking status: Former   Smokeless tobacco: Never  Substance Use Topics   Alcohol use: Yes   Drug use: Never     Family Hx: No family history on file.   Review of Systems:   Please see the history of present illness.    All other systems reviewed and are negative.     EKGs/Labs/Other Test Reviewed:    EKG: Sinus rhythm with anterior infarct pattern and T wave inversions in the inferior leads and lateral leads  Prior CV studies: None  available  Imaging studies that I have independently reviewed today: None available  Recent Labs: 12/09/2020: ALT 23 04/11/2021: BUN 14; Creatinine, Ser 0.87; Hemoglobin 13.8; Platelets 221; Potassium 3.6; Sodium 137   Recent Lipid Panel No results found for: CHOL, TRIG, HDL, LDLCALC, LDLDIRECT  Risk Assessment/Calculations:          Physical Exam:    VS:  BP 136/80    Pulse 62    Ht 5\' 6"  (1.676 m)    Wt 215 lb (97.5 kg)    SpO2 95%    BMI 34.70 kg/m    Wt Readings from Last 3 Encounters:  07/31/21 215 lb (97.5 kg)  05/29/21 215 lb 6.4 oz (97.7 kg)  04/11/21 242 lb 8.1 oz (110 kg)    GENERAL:  No apparent distress, AOx3 HEENT:  No carotid bruits, +2 carotid  impulses, no scleral icterus CAR: RRR no murmurs, gallops, rubs, or thrills RES:  Clear to auscultation bilaterally ABD:  Soft, nontender, nondistended, positive bowel sounds x 4 VASC:  +2 radial pulses, +2 carotid pulses, palpable pedal pulses NEURO:  CN 2-12 grossly intact; motor and sensory grossly intact PSYCH:  No active depression or anxiety EXT:  No edema, ecchymosis, or cyanosis  Signed, Early Osmond, MD  07/31/2021 2:24 PM    [address], [place], [place]  [address] Phone: [phone]; Fax: [phone]   Note:  This document was prepared using Dragon voice recognition software and may include unintentional dictation errors.

## 2021-08-05 DIAGNOSIS — Z419 Encounter for procedure for purposes other than remedying health state, unspecified: Secondary | ICD-10-CM | POA: Diagnosis not present

## 2021-08-05 DIAGNOSIS — L603 Nail dystrophy: Secondary | ICD-10-CM | POA: Diagnosis not present

## 2021-08-05 DIAGNOSIS — D2239 Melanocytic nevi of other parts of face: Secondary | ICD-10-CM | POA: Diagnosis not present

## 2021-08-05 DIAGNOSIS — L821 Other seborrheic keratosis: Secondary | ICD-10-CM | POA: Diagnosis not present

## 2021-08-05 DIAGNOSIS — D2271 Melanocytic nevi of right lower limb, including hip: Secondary | ICD-10-CM | POA: Diagnosis not present

## 2021-08-05 DIAGNOSIS — D485 Neoplasm of uncertain behavior of skin: Secondary | ICD-10-CM | POA: Diagnosis not present

## 2021-08-12 ENCOUNTER — Other Ambulatory Visit (HOSPITAL_COMMUNITY): Payer: Self-pay

## 2021-08-18 ENCOUNTER — Telehealth (HOSPITAL_COMMUNITY): Payer: Self-pay

## 2021-08-18 ENCOUNTER — Encounter: Payer: Self-pay | Admitting: Internal Medicine

## 2021-08-18 NOTE — Telephone Encounter (Signed)
The patient stated that she was at work and is unable to talk. Asked to call back when she was able. S.Naila Elizondo EMTP

## 2021-08-20 ENCOUNTER — Encounter (HOSPITAL_COMMUNITY): Payer: 59

## 2021-08-20 ENCOUNTER — Other Ambulatory Visit (HOSPITAL_COMMUNITY): Payer: 59

## 2021-08-21 ENCOUNTER — Encounter: Payer: Self-pay | Admitting: Internal Medicine

## 2021-08-26 ENCOUNTER — Telehealth (HOSPITAL_COMMUNITY): Payer: Self-pay | Admitting: Internal Medicine

## 2021-08-26 NOTE — Telephone Encounter (Signed)
Patient called and cancelled echo and Myoview for reason below: ? ?08/26/2021 1:26 PM OV:ZCHYI, SELENA  ?Cancel Rsn: Financial (can' afford) ? ? ?Orders will be removed from the [place] and if patient calls back to schedule at a later date we will reinstate the orders. ?

## 2021-09-03 ENCOUNTER — Encounter (HOSPITAL_COMMUNITY): Payer: Self-pay

## 2021-09-03 ENCOUNTER — Ambulatory Visit (HOSPITAL_COMMUNITY): Payer: 59

## 2021-09-03 ENCOUNTER — Encounter (HOSPITAL_COMMUNITY): Payer: 59

## 2021-09-10 ENCOUNTER — Other Ambulatory Visit: Payer: 59

## 2021-09-10 ENCOUNTER — Other Ambulatory Visit: Payer: Self-pay

## 2021-09-10 ENCOUNTER — Other Ambulatory Visit (HOSPITAL_COMMUNITY): Payer: Self-pay

## 2021-09-10 ENCOUNTER — Other Ambulatory Visit: Payer: Self-pay | Admitting: Internal Medicine

## 2021-09-10 DIAGNOSIS — I25708 Atherosclerosis of coronary artery bypass graft(s), unspecified, with other forms of angina pectoris: Secondary | ICD-10-CM | POA: Diagnosis not present

## 2021-09-10 DIAGNOSIS — E785 Hyperlipidemia, unspecified: Secondary | ICD-10-CM | POA: Diagnosis not present

## 2021-09-10 LAB — LIPID PANEL
Chol/HDL Ratio: 4.4 ratio (ref 0.0–4.4)
Cholesterol, Total: 146 mg/dL (ref 100–199)
HDL: 33 mg/dL — ABNORMAL LOW (ref >39)
LDL Chol Calc (NIH): 74 mg/dL (ref 0–99)
Triglycerides: 237 mg/dL — ABNORMAL HIGH (ref 0–149)
VLDL Cholesterol Cal: 39 mg/dL (ref 5–40)

## 2021-09-10 LAB — HEPATIC FUNCTION PANEL
ALT: 24 IU/L (ref 0–32)
AST: 18 IU/L (ref 0–40)
Albumin: 4.2 g/dL (ref 3.8–4.9)
Alkaline Phosphatase: 114 IU/L (ref 44–121)
Bilirubin Total: 0.7 mg/dL (ref 0.0–1.2)
Bilirubin, Direct: 0.16 mg/dL (ref 0.00–0.40)
Total Protein: 6.8 g/dL (ref 6.0–8.5)

## 2021-09-12 ENCOUNTER — Other Ambulatory Visit (HOSPITAL_COMMUNITY): Payer: Self-pay

## 2021-09-30 ENCOUNTER — Encounter: Payer: Self-pay | Admitting: Internal Medicine

## 2021-09-30 ENCOUNTER — Ambulatory Visit: Payer: 59 | Admitting: Internal Medicine

## 2021-09-30 ENCOUNTER — Other Ambulatory Visit (HOSPITAL_COMMUNITY): Payer: Self-pay

## 2021-09-30 VITALS — BP 118/80 | HR 71 | Resp 18 | Ht 66.0 in | Wt 212.0 lb

## 2021-09-30 DIAGNOSIS — J3089 Other allergic rhinitis: Secondary | ICD-10-CM | POA: Diagnosis not present

## 2021-09-30 DIAGNOSIS — R41840 Attention and concentration deficit: Secondary | ICD-10-CM | POA: Diagnosis not present

## 2021-09-30 DIAGNOSIS — B001 Herpesviral vesicular dermatitis: Secondary | ICD-10-CM

## 2021-09-30 MED ORDER — IPRATROPIUM BROMIDE 0.03 % NA SOLN
2.0000 | Freq: Four times a day (QID) | NASAL | 12 refills | Status: DC | PRN
  Filled 2021-09-30: qty 30, 38d supply, fill #0

## 2021-09-30 MED ORDER — ACYCLOVIR 5 % EX OINT
1.0000 "application " | TOPICAL_OINTMENT | CUTANEOUS | 11 refills | Status: DC
  Filled 2021-09-30: qty 15, 30d supply, fill #0

## 2021-09-30 NOTE — Progress Notes (Signed)
? ?  Subjective:  ? ?Patient ID: Tricia King, female    DOB: May 12, 1961, 61 y.o.   MRN: [id number] ? ?HPI ?The patient is a 61 YO female coming in for post nasal drip and other concerns. ? ?Review of Systems  ?Constitutional: Negative.  Negative for fatigue, fever and unexpected weight change.  ?HENT:  Positive for congestion and postnasal drip. Negative for ear discharge, ear pain, sinus pain, sneezing, sore throat, tinnitus, trouble swallowing and voice change.   ?Eyes: Negative.   ?Respiratory:  Negative for cough, chest tightness, shortness of breath and wheezing.   ?Cardiovascular: Negative.  Negative for chest pain, palpitations and leg swelling.  ?Gastrointestinal: Negative.  Negative for abdominal distention, abdominal pain, constipation, diarrhea, nausea and vomiting.  ?Musculoskeletal: Negative.   ?Skin: Negative.   ?Neurological: Negative.   ?Psychiatric/Behavioral: Negative.    ? ?Objective:  ?Physical Exam ?Constitutional:   ?   Appearance: She is well-developed.  ?HENT:  ?   Head: Normocephalic and atraumatic.  ?   Comments: Oropharynx with redness and clear drainage, nose with swollen turbinates, TMs normal bilaterally.  ?Neck:  ?   Thyroid: No thyromegaly.  ?Cardiovascular:  ?   Rate and Rhythm: Normal rate and regular rhythm.  ?Pulmonary:  ?   Effort: Pulmonary effort is normal. No respiratory distress.  ?   Breath sounds: Normal breath sounds. No wheezing or rales.  ?Abdominal:  ?   General: Bowel sounds are normal. There is no distension.  ?   Palpations: Abdomen is soft.  ?   Tenderness: There is no abdominal tenderness. There is no rebound.  ?Musculoskeletal:     ?   General: No tenderness.  ?   Cervical back: Normal range of motion.  ?Lymphadenopathy:  ?   Cervical: No cervical adenopathy.  ?Skin: ?   General: Skin is warm and dry.  ?Neurological:  ?   Mental Status: She is alert and oriented to person, place, and time.  ?   Coordination: Coordination normal.  ? ? ?Vitals:  ? 09/30/21 0959  ?BP:  118/80  ?Pulse: 71  ?Resp: 18  ?SpO2: 95%  ?Weight: 212 lb (96.2 kg)  ?Height: '5\' 6"'$  (1.676 m)  ? ?This visit occurred during the SARS-CoV-2 public health emergency.  Safety protocols were in place, including screening questions prior to the visit, additional usage of staff PPE, and extensive cleaning of exam room while observing appropriate contact time as indicated for disinfecting solutions.  ? ?Assessment & Plan:  ? ?

## 2021-09-30 NOTE — Patient Instructions (Addendum)
We have sent in the nose spray to use up to 4 times a day as needed.  ? ?You can call [place] attention specialist to get tested for ADD/ADHD and they can treat as well. ? ? ?

## 2021-10-02 DIAGNOSIS — R41840 Attention and concentration deficit: Secondary | ICD-10-CM | POA: Insufficient documentation

## 2021-10-02 DIAGNOSIS — B001 Herpesviral vesicular dermatitis: Secondary | ICD-10-CM | POA: Insufficient documentation

## 2021-10-02 NOTE — Assessment & Plan Note (Signed)
Referral to psychology for ADD/ADHD testing. She has realized she has some attention problems. She is on lexapro 10 mg daily and this helps with her anxiety and MDD but still trouble with maintaining focus. ?

## 2021-10-02 NOTE — Assessment & Plan Note (Signed)
Rx ipratropium nasal spray to use prn up to QID for nasal dripping. Continue antihistamine otc daily.  ?

## 2021-10-02 NOTE — Assessment & Plan Note (Signed)
Has valtrex which she takes as needed only for flares however needs topical acyclovir as this helps flares resolve quicker. Rx done today for this. ?

## 2021-10-12 ENCOUNTER — Other Ambulatory Visit (HOSPITAL_COMMUNITY): Payer: Self-pay

## 2021-10-14 ENCOUNTER — Other Ambulatory Visit (HOSPITAL_COMMUNITY): Payer: Self-pay

## 2021-10-27 ENCOUNTER — Encounter (HOSPITAL_COMMUNITY): Payer: Self-pay | Admitting: Internal Medicine

## 2021-10-27 NOTE — Progress Notes (Signed)
  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  Coronary artery disease status post CABG: Continue aspirin, statin, and strict blood pressure control.  Follow-up in 1 year or earlier if needed.  2.  Hypertension:  3.  Dyslipidemia: LDL recently was near goal.  Continue current therapy.  4.  Factor V Leiden mutation: The patient does not require chronic anticoagulation.           History of Present Illness:    FOCUSED CARDIOVASCULAR PROBLEM LIST:   1.  Coronary artery disease status post CABG consisting of a LIMA to LAD, left radial to obtuse marginal, and vein graft to PDA 2019 2.  Hypertension 3.  Hyperlipidemia with statin intolerance 4.  Factor V Leiden mutation (heterozygous)   February 2023: Patient seen in initial consultation.  She had reported atypical chest pain that sounded like it was caused by a GI etiology.  She was referred for a Lexiscan stress test and echocardiogram.  She was started on Imdur 30 mg at bedtime and her amlodipine 5 mg was restarted due to elevated blood pressure.  These tests were not done due to financial hardship.  Today: The patient was seen by her primary care provider recently with no reports of chest pain.       Current Medications: No outpatient medications have been marked as taking for the 10/29/21 encounter (Office Visit) with Early Osmond, MD.     Allergies:     Enoxaparin, Heparin (porcine), Other, Sulfa antibiotics, and Sulfamethoxazole   Social History:   Social History   Tobacco Use   Smoking status: Former   Smokeless tobacco: Never  Substance Use Topics   Alcohol use: Yes   Drug use: Never     Family Hx: No family history on file.   Review of Systems:   Please see the history of present illness.    All other systems reviewed and are negative.     EKGs/Labs/Other Test Reviewed:    EKG: Sinus rhythm with anterior infarct pattern and T wave inversions in the inferior leads and lateral leads  Prior CV studies: None available  Imaging studies that I have independently reviewed today: None available  Recent Labs: 09/10/2021: ALT 24   Recent Lipid Panel Lab Results  Component Value Date/Time   CHOL 146 09/10/2021 09:44 AM   TRIG 237 (H) 09/10/2021 09:44 AM   HDL 33 (L) 09/10/2021 09:44 AM   LDLCALC 74 09/10/2021 09:44 AM    Risk Assessment/Calculations:          Physical Exam:      Signed, Early Osmond, MD  09/03/2022 3:56 PM    [facility] [address], [place], [place]  [address] Phone: [phone]; Fax: [phone]   Note:  This document was prepared using Dragon voice recognition software and may include unintentional dictation errors.

## 2021-10-29 ENCOUNTER — Ambulatory Visit (INDEPENDENT_AMBULATORY_CARE_PROVIDER_SITE_OTHER): Payer: 59 | Admitting: Internal Medicine

## 2021-10-29 DIAGNOSIS — D6851 Activated protein C resistance: Secondary | ICD-10-CM

## 2021-10-29 DIAGNOSIS — I10 Essential (primary) hypertension: Secondary | ICD-10-CM

## 2021-10-29 DIAGNOSIS — E785 Hyperlipidemia, unspecified: Secondary | ICD-10-CM

## 2021-10-29 DIAGNOSIS — I25708 Atherosclerosis of coronary artery bypass graft(s), unspecified, with other forms of angina pectoris: Secondary | ICD-10-CM

## 2021-11-09 ENCOUNTER — Encounter (HOSPITAL_COMMUNITY): Payer: Self-pay | Admitting: *Deleted

## 2021-11-12 ENCOUNTER — Ambulatory Visit (HOSPITAL_COMMUNITY): Payer: 59 | Attending: Cardiovascular Disease

## 2021-11-12 ENCOUNTER — Ambulatory Visit (HOSPITAL_BASED_OUTPATIENT_CLINIC_OR_DEPARTMENT_OTHER): Payer: 59

## 2021-11-12 DIAGNOSIS — I25708 Atherosclerosis of coronary artery bypass graft(s), unspecified, with other forms of angina pectoris: Secondary | ICD-10-CM | POA: Insufficient documentation

## 2021-11-12 DIAGNOSIS — R079 Chest pain, unspecified: Secondary | ICD-10-CM | POA: Diagnosis not present

## 2021-11-12 LAB — MYOCARDIAL PERFUSION IMAGING
LV dias vol: 78 mL (ref 46–106)
LV sys vol: 27 mL
Nuc Stress EF: 66 %
Peak HR: 93 {beats}/min
Rest HR: 64 {beats}/min
Rest Nuclear Isotope Dose: 10.6 mCi
SDS: 0
SRS: 0
SSS: 0
ST Depression (mm): 0 mm
Stress Nuclear Isotope Dose: 31.7 mCi
TID: 0.94

## 2021-11-12 LAB — ECHOCARDIOGRAM COMPLETE
Area-P 1/2: 3.12 cm2
Height: 66 in
S' Lateral: 3.2 cm
Weight: 3392 oz

## 2021-11-12 MED ORDER — TECHNETIUM TC 99M TETROFOSMIN IV KIT
31.7000 | PACK | Freq: Once | INTRAVENOUS | Status: AC | PRN
  Administered 2021-11-12: 31.7 via INTRAVENOUS

## 2021-11-12 MED ORDER — TECHNETIUM TC 99M TETROFOSMIN IV KIT
10.6000 | PACK | Freq: Once | INTRAVENOUS | Status: AC | PRN
  Administered 2021-11-12: 10.6 via INTRAVENOUS

## 2021-11-12 MED ORDER — REGADENOSON 0.4 MG/5ML IV SOLN
0.4000 mg | Freq: Once | INTRAVENOUS | Status: AC
  Administered 2021-11-12: 0.4 mg via INTRAVENOUS

## 2021-11-19 IMAGING — DX DG CHEST 2V
2 series · 2 of 2 positions shown · non-contrast
Comparison: Chest radiograph dated 08/30/2018.

CLINICAL DATA: Left upper extremity pain.

EXAM:
CHEST - 2 VIEW

[w chest pa]
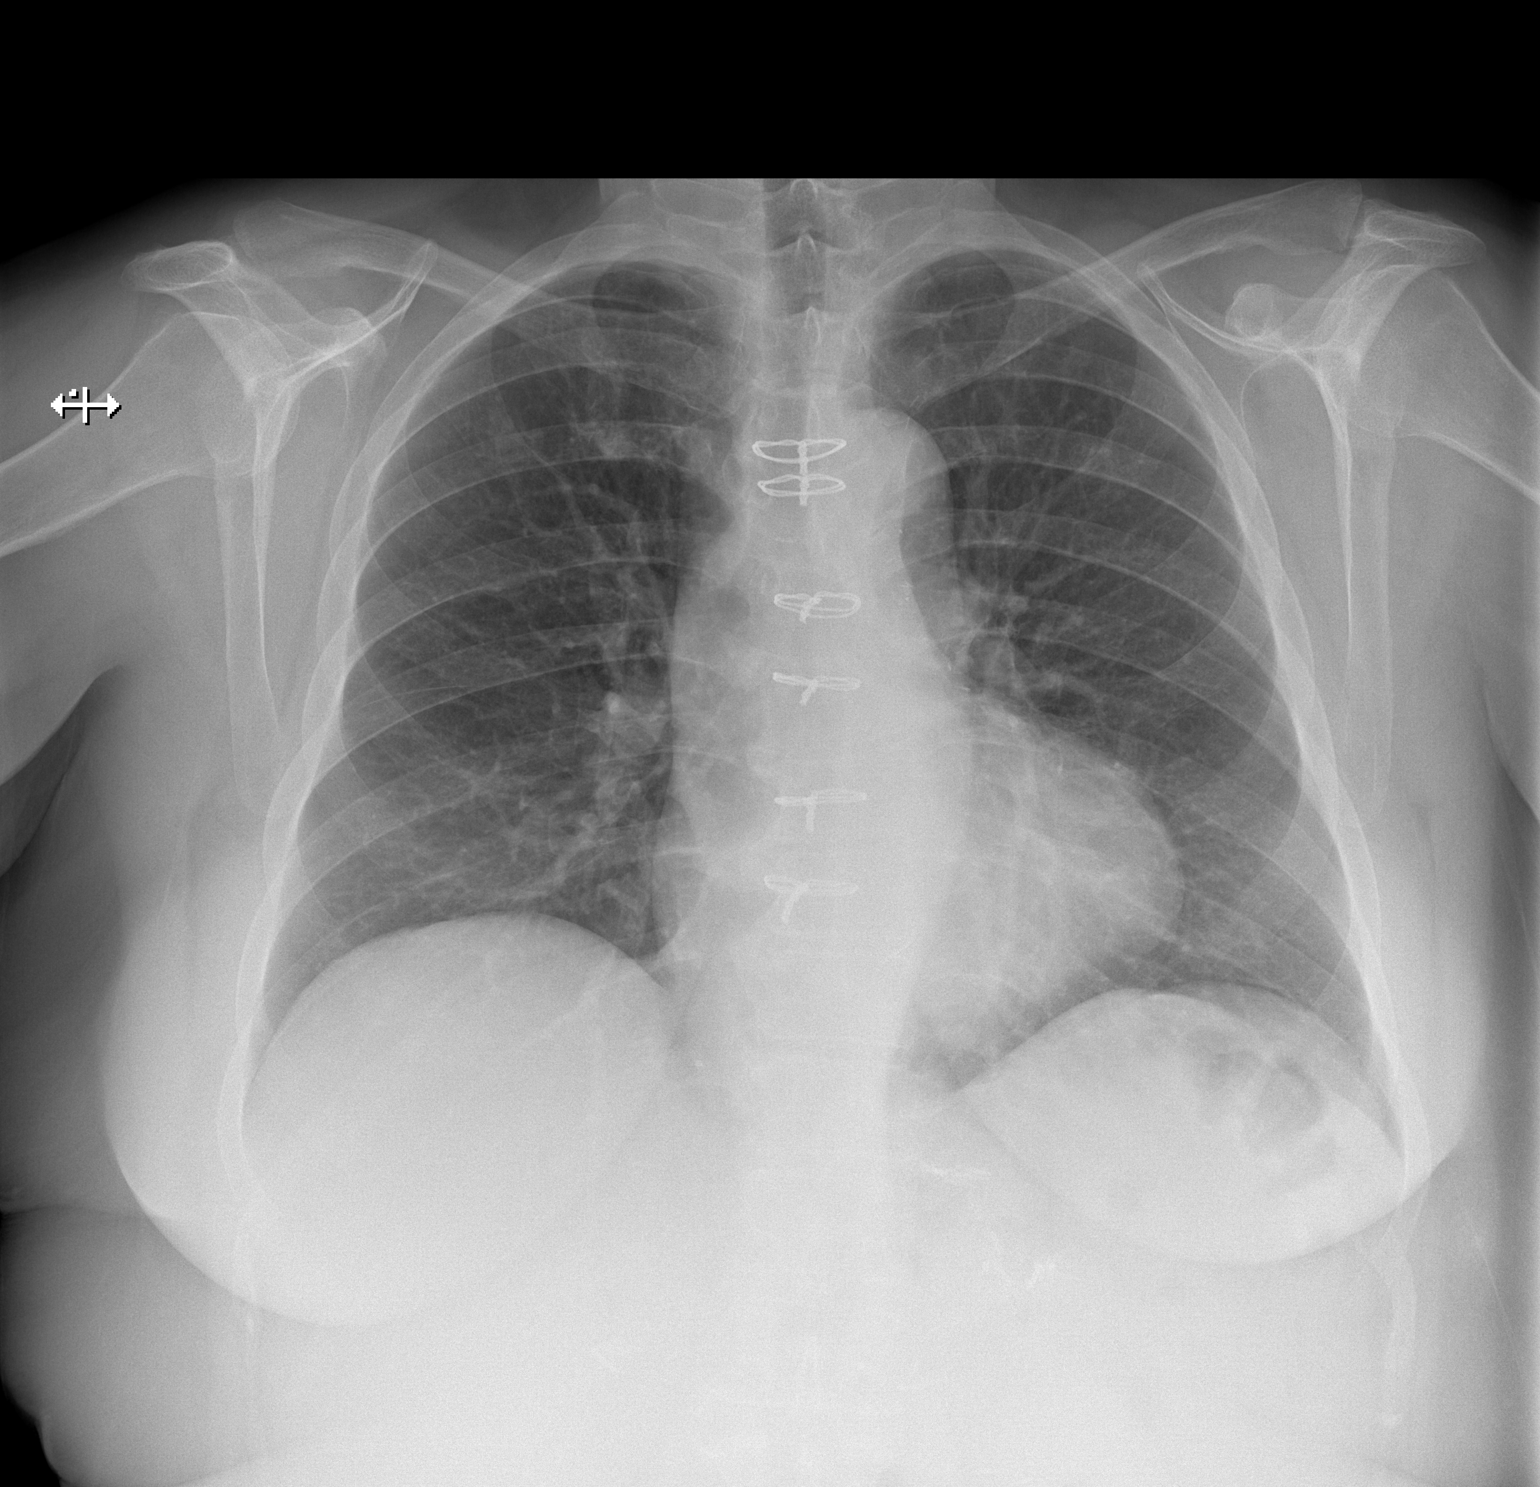

[w chest lat]
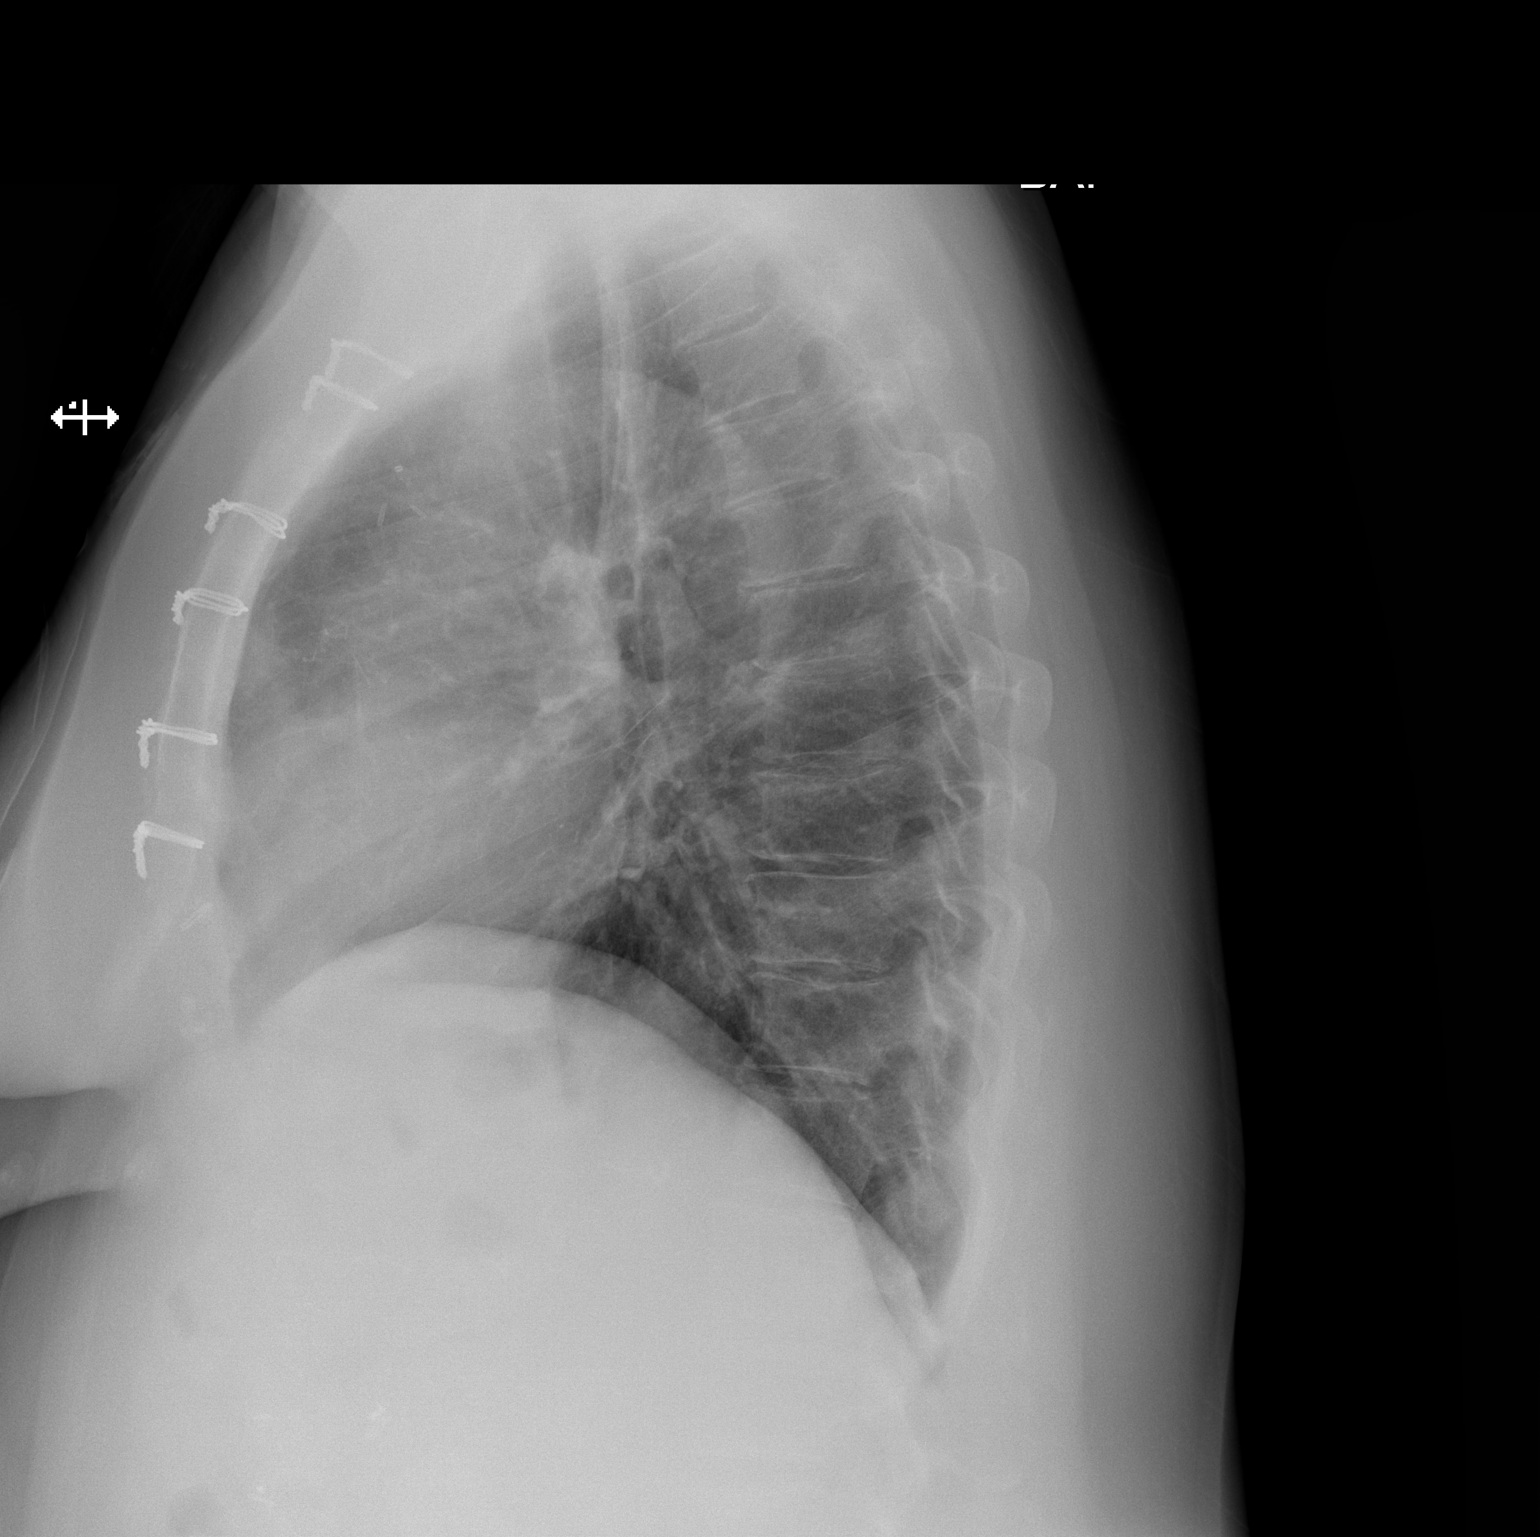

[2 of 2 positions shown; findings below may reference images not displayed]

FINDINGS: No focal consolidation, pleural effusion, or pneumothorax. The
cardiac silhouette is within limits. Median sternotomy wires and
CABG vascular clips. No acute osseous pathology.
IMPRESSION: No active cardiopulmonary disease.

## 2022-01-17 ENCOUNTER — Encounter: Payer: Self-pay | Admitting: Internal Medicine

## 2022-01-19 ENCOUNTER — Other Ambulatory Visit (HOSPITAL_COMMUNITY): Payer: Self-pay

## 2022-01-19 MED ORDER — BUPROPION HCL ER (XL) 150 MG PO TB24
150.0000 mg | ORAL_TABLET | Freq: Every day | ORAL | 2 refills | Status: DC
  Filled 2022-01-19: qty 30, 30d supply, fill #0
  Filled 2022-03-14: qty 30, 30d supply, fill #1
  Filled 2022-04-05 – 2022-04-07 (×2): qty 30, 30d supply, fill #2

## 2022-01-26 ENCOUNTER — Other Ambulatory Visit (HOSPITAL_COMMUNITY): Payer: Self-pay

## 2022-01-26 ENCOUNTER — Other Ambulatory Visit: Payer: Self-pay | Admitting: Internal Medicine

## 2022-01-29 ENCOUNTER — Other Ambulatory Visit (HOSPITAL_COMMUNITY): Payer: Self-pay

## 2022-01-29 MED ORDER — ESCITALOPRAM OXALATE 10 MG PO TABS
10.0000 mg | ORAL_TABLET | Freq: Every day | ORAL | 1 refills | Status: DC
Start: 2022-01-29 — End: 2022-08-27
  Filled 2022-01-29: qty 90, 90d supply, fill #0
  Filled 2022-06-03: qty 90, 90d supply, fill #1

## 2022-01-29 MED ORDER — LISINOPRIL 20 MG PO TABS
40.0000 mg | ORAL_TABLET | Freq: Every day | ORAL | 1 refills | Status: DC
Start: 2022-01-29 — End: 2022-09-14
  Filled 2022-01-29: qty 180, 90d supply, fill #0
  Filled 2022-06-03: qty 180, 90d supply, fill #1

## 2022-01-29 MED ORDER — DIAZEPAM 5 MG PO TABS
5.0000 mg | ORAL_TABLET | Freq: Two times a day (BID) | ORAL | 0 refills | Status: DC | PRN
Start: 2022-01-29 — End: 2022-06-03
  Filled 2022-01-29: qty 15, 8d supply, fill #0

## 2022-02-15 ENCOUNTER — Encounter: Payer: Self-pay | Admitting: Obstetrics and Gynecology

## 2022-02-15 ENCOUNTER — Ambulatory Visit (INDEPENDENT_AMBULATORY_CARE_PROVIDER_SITE_OTHER): Payer: 59 | Admitting: Obstetrics and Gynecology

## 2022-02-15 ENCOUNTER — Other Ambulatory Visit (HOSPITAL_COMMUNITY)
Admission: RE | Admit: 2022-02-15 | Discharge: 2022-02-15 | Disposition: A | Discharge status: Home / Self Care | Payer: 59 | Source: Ambulatory Visit | Attending: Obstetrics and Gynecology | Admitting: Obstetrics and Gynecology

## 2022-02-15 VITALS — BP 144/91 | HR 72 | Ht 65.0 in | Wt 228.8 lb

## 2022-02-15 DIAGNOSIS — Z01419 Encounter for gynecological examination (general) (routine) without abnormal findings: Secondary | ICD-10-CM | POA: Diagnosis not present

## 2022-02-15 NOTE — Progress Notes (Signed)
Subjective:     Tricia King is a 61 y.o. female postmenopausal with BMI 38 who is here for a comprehensive physical exam. The patient reports no episodes of postmenopausal vaginal bleeding since menopause 15 years ago. Patient is sexually active without complaints. She denies pelvic pain or abnormal discharge. She denies urinary incontinence and reports regular bowel movements. Patient is due for her mammogram. She is without any other complaints   Past Medical History:  Diagnosis Date   Chicken pox    Diverticulitis    Gestational diabetes    Heart disease    Hyperlipidemia    Hypertension    Past Surgical History:  Procedure Laterality Date   CESAREAN SECTION     CHOLECYSTECTOMY     CORONARY ARTERY BYPASS GRAFT     GASTRIC BYPASS     Lasix Eye     History reviewed. No pertinent family history.  Social History   Socioeconomic History   Marital status: Married    Spouse name: Not on file   Number of children: Not on file   Years of education: Not on file   Highest education level: Not on file  Occupational History   Not on file  Tobacco Use   Smoking status: Former   Smokeless tobacco: Never  Substance and Sexual Activity   Alcohol use: Yes   Drug use: Never   Sexual activity: Yes    Partners: Male  Other Topics Concern   Not on file  Social History Narrative   Not on file   Social Determinants of Health   Financial Resource Strain: Not on file  Food Insecurity: Not on file  Transportation Needs: Not on file  Physical Activity: Not on file  Stress: Not on file  Social Connections: Not on file  Intimate Partner Violence: Not on file   Health Maintenance  Topic Date Due   HIV Screening  Never done   Hepatitis C Screening  Never done   TETANUS/TDAP  Never done   PAP SMEAR-Modifier  Never done   COLONOSCOPY (Pts 45-49yr Insurance coverage will need to be confirmed)  Never done   MAMMOGRAM  Never done   COVID-19 Vaccine (5 - Pfizer series) 12/18/2020    Zoster Vaccines- Shingrix (2 of 2) 05/07/2021   INFLUENZA VACCINE  01/19/2022   HPV VACCINES  Aged Out       Review of Systems Pertinent items noted in HPI and remainder of comprehensive ROS otherwise negative.   Objective:  Blood pressure (!) 144/91, pulse 72, height '5\' 5"'$  (1.651 m), weight 228 lb 12.8 oz (103.8 kg).   GENERAL: Well-developed, well-nourished female in no acute distress.  HEENT: Normocephalic, atraumatic. Sclerae anicteric.  NECK: Supple. Normal thyroid.  LUNGS: Clear to auscultation bilaterally.  HEART: Regular rate and rhythm. BREASTS: Symmetric in size. No palpable masses or lymphadenopathy, skin changes, or nipple drainage. ABDOMEN: Soft, nontender, nondistended. No organomegaly. PELVIC: Normal external female genitalia. Vagina is pink and rugated.  Normal discharge. Normal appearing cervix. Uterus is normal in size. No adnexal mass or tenderness. Chaperone present during the pelvic exam EXTREMITIES: No cyanosis, clubbing, or edema, 2+ distal pulses.     Assessment:    Healthy female exam.      Plan:    Pap smear collected Screening mammogram Patient will be contacted with abnormal results Patient recently seen by PCP See After Visit Summary for Counseling Recommendations

## 2022-02-15 NOTE — Progress Notes (Signed)
Establish care/ annual Last Pap 2 years ago per pt, distant abnormal in "teens" Reports discharge from left breast, has been seen and evaluated for multiple times over 15 years. Reports no vaginal complaints Denies urinary incontinence.

## 2022-02-17 ENCOUNTER — Encounter: Payer: Self-pay | Admitting: Internal Medicine

## 2022-02-18 ENCOUNTER — Ambulatory Visit
Admission: RE | Admit: 2022-02-18 | Discharge: 2022-02-18 | Disposition: A | Discharge status: Home / Self Care | Payer: 59 | Source: Ambulatory Visit | Attending: Obstetrics and Gynecology | Admitting: Obstetrics and Gynecology

## 2022-02-18 DIAGNOSIS — Z1231 Encounter for screening mammogram for malignant neoplasm of breast: Secondary | ICD-10-CM | POA: Diagnosis not present

## 2022-02-18 DIAGNOSIS — Z01419 Encounter for gynecological examination (general) (routine) without abnormal findings: Secondary | ICD-10-CM

## 2022-02-18 NOTE — Telephone Encounter (Signed)
Pt is having concerns of continuing to gain weight and is wondering if you advise for her to start on either [facility] or Ozempic?

## 2022-02-19 LAB — CYTOLOGY - PAP
Comment: NEGATIVE
Diagnosis: NEGATIVE
High risk HPV: NEGATIVE

## 2022-03-15 ENCOUNTER — Other Ambulatory Visit (HOSPITAL_COMMUNITY): Payer: Self-pay

## 2022-03-18 ENCOUNTER — Other Ambulatory Visit (HOSPITAL_COMMUNITY): Payer: Self-pay

## 2022-04-02 ENCOUNTER — Other Ambulatory Visit (HOSPITAL_COMMUNITY): Payer: Self-pay

## 2022-04-02 MED ORDER — SEMAGLUTIDE-WEIGHT MANAGEMENT 0.5 MG/0.5ML ~~LOC~~ SOAJ
0.5000 mg | SUBCUTANEOUS | 0 refills | Status: DC
  Filled 2022-04-02 – 2022-07-27 (×2): qty 2, 28d supply, fill #0

## 2022-04-02 MED ORDER — SEMAGLUTIDE-WEIGHT MANAGEMENT 1 MG/0.5ML ~~LOC~~ SOAJ
1.0000 mg | SUBCUTANEOUS | 0 refills | Status: DC
  Filled 2022-04-02: qty 2, 28d supply, fill #0

## 2022-04-02 MED ORDER — SEMAGLUTIDE-WEIGHT MANAGEMENT 2.4 MG/0.75ML ~~LOC~~ SOAJ
2.4000 mg | SUBCUTANEOUS | 0 refills | Status: DC
  Filled 2022-04-02: qty 3, 28d supply, fill #0

## 2022-04-02 MED ORDER — SEMAGLUTIDE-WEIGHT MANAGEMENT 1.7 MG/0.75ML ~~LOC~~ SOAJ
1.7000 mg | SUBCUTANEOUS | 0 refills | Status: DC
  Filled 2022-04-02: qty 3, 28d supply, fill #0

## 2022-04-02 MED ORDER — SEMAGLUTIDE-WEIGHT MANAGEMENT 0.25 MG/0.5ML ~~LOC~~ SOAJ
0.2500 mg | SUBCUTANEOUS | 0 refills | Status: DC
  Filled 2022-04-02 – 2022-07-24 (×3): qty 2, 28d supply, fill #0

## 2022-04-05 ENCOUNTER — Other Ambulatory Visit (HOSPITAL_COMMUNITY): Payer: Self-pay

## 2022-04-07 ENCOUNTER — Encounter: Payer: Self-pay | Admitting: Internal Medicine

## 2022-04-07 ENCOUNTER — Ambulatory Visit (INDEPENDENT_AMBULATORY_CARE_PROVIDER_SITE_OTHER): Payer: 59 | Admitting: Internal Medicine

## 2022-04-07 ENCOUNTER — Other Ambulatory Visit (HOSPITAL_COMMUNITY): Payer: Self-pay

## 2022-04-07 VITALS — BP 122/80 | HR 78 | Temp 98.4°F | Ht 65.0 in | Wt 227.0 lb

## 2022-04-07 DIAGNOSIS — E785 Hyperlipidemia, unspecified: Secondary | ICD-10-CM | POA: Diagnosis not present

## 2022-04-07 DIAGNOSIS — D6851 Activated protein C resistance: Secondary | ICD-10-CM | POA: Diagnosis not present

## 2022-04-07 DIAGNOSIS — R41840 Attention and concentration deficit: Secondary | ICD-10-CM

## 2022-04-07 DIAGNOSIS — Z0000 Encounter for general adult medical examination without abnormal findings: Secondary | ICD-10-CM | POA: Diagnosis not present

## 2022-04-07 DIAGNOSIS — I10 Essential (primary) hypertension: Secondary | ICD-10-CM

## 2022-04-07 DIAGNOSIS — I25708 Atherosclerosis of coronary artery bypass graft(s), unspecified, with other forms of angina pectoris: Secondary | ICD-10-CM

## 2022-04-07 MED ORDER — BUPROPION HCL ER (XL) 300 MG PO TB24
300.0000 mg | ORAL_TABLET | Freq: Every day | ORAL | 3 refills | Status: DC
  Filled 2022-04-07: qty 90, 90d supply, fill #0
  Filled 2022-06-03 – 2022-07-24 (×3): qty 90, 90d supply, fill #1
  Filled 2022-08-27 – 2022-10-04 (×5): qty 90, 90d supply, fill #2
  Filled 2023-01-18 – 2023-01-20 (×2): qty 90, 90d supply, fill #3

## 2022-04-07 NOTE — Progress Notes (Unsigned)
   Subjective:   Patient ID: Tricia King, female    DOB: August 04, 1960, 61 y.o.   MRN: [id number]  HPI The patient is here for physical.  PMH, [facility], social history reviewed and updated  Review of Systems  Objective:  Physical Exam  Vitals:   04/07/22 1112  BP: 122/80  Pulse: 78  Temp: 98.4 F (36.9 C)  TempSrc: Oral  SpO2: 94%  Weight: 227 lb (103 kg)  Height: '5\' 5"'$  (1.651 m)    Assessment & Plan:

## 2022-04-08 DIAGNOSIS — Z0000 Encounter for general adult medical examination without abnormal findings: Secondary | ICD-10-CM | POA: Insufficient documentation

## 2022-04-08 NOTE — Assessment & Plan Note (Signed)
Taking lipitor 40 mg daily and recent lipid panel at goal.

## 2022-04-08 NOTE — Assessment & Plan Note (Signed)
Doing some better but mood is still needing some assistance. Will increase wellbutrin to 300 mg daily. Continue lexapro 10 mg daily as well. She will let us know in 4 weeks how she is doing.

## 2022-04-08 NOTE — Assessment & Plan Note (Signed)
Flu shot at work. Covid-19 counseled. Shingrix took 1 did not want second. Tetanus up to date. Colonoscopy up to date. Mammogram up to date, pap smear up to date. Counseled about sun safety and mole surveillance. Counseled about the dangers of distracted driving. Given 10 year screening recommendations.

## 2022-04-08 NOTE — Assessment & Plan Note (Signed)
Complicated by hyperlipidemia and CAD. Prescribed wegovy and waiting for this to be in stock to start. She is working on diet and exercise as well in the meantime. Hx gastric bypass.

## 2022-04-08 NOTE — Assessment & Plan Note (Signed)
No treatment indicated at this time. She is at higher than average risk of clots and would need progesterone based hormone replacement.

## 2022-04-08 NOTE — Assessment & Plan Note (Signed)
BP at goal on amlodipine 5 mg daily, imdur 30 mg daily and lisinopril 40 mg daily and metoprolol 25 mg BID.

## 2022-04-08 NOTE — Assessment & Plan Note (Signed)
No new anginal symptoms. Taking aspirin 81 mg daily and lipitor 40 mg daily. Recent lipid at goal. Taking imdur 30 mg daily to continue anginal symptoms.

## 2022-04-20 ENCOUNTER — Telehealth: Payer: Self-pay | Admitting: Internal Medicine

## 2022-04-20 NOTE — Telephone Encounter (Signed)
Someone from [employer] called about a PA for [facility] for patient and needed to speak to a CMA, Before I could get anyone on the phone they hung up

## 2022-04-23 ENCOUNTER — Other Ambulatory Visit (HOSPITAL_COMMUNITY): Payer: Self-pay

## 2022-04-29 ENCOUNTER — Encounter: Payer: Self-pay | Admitting: Internal Medicine

## 2022-04-29 ENCOUNTER — Other Ambulatory Visit (HOSPITAL_COMMUNITY): Payer: Self-pay

## 2022-05-05 NOTE — Telephone Encounter (Signed)
I was able to start the PA and it has been submitted. Awaiting a response with approval or denial.  Key: [id number]

## 2022-05-10 ENCOUNTER — Other Ambulatory Visit (HOSPITAL_COMMUNITY): Payer: Self-pay

## 2022-05-11 ENCOUNTER — Other Ambulatory Visit (HOSPITAL_COMMUNITY): Payer: Self-pay

## 2022-05-12 ENCOUNTER — Other Ambulatory Visit (HOSPITAL_COMMUNITY): Payer: Self-pay

## 2022-05-14 ENCOUNTER — Other Ambulatory Visit (HOSPITAL_COMMUNITY): Payer: Self-pay

## 2022-05-19 ENCOUNTER — Other Ambulatory Visit (HOSPITAL_COMMUNITY): Payer: Self-pay

## 2022-05-21 ENCOUNTER — Other Ambulatory Visit (HOSPITAL_COMMUNITY): Payer: Self-pay

## 2022-05-25 ENCOUNTER — Other Ambulatory Visit (HOSPITAL_COMMUNITY): Payer: Self-pay

## 2022-06-03 ENCOUNTER — Other Ambulatory Visit: Payer: Self-pay

## 2022-06-03 ENCOUNTER — Other Ambulatory Visit: Payer: Self-pay | Admitting: Internal Medicine

## 2022-06-03 ENCOUNTER — Other Ambulatory Visit (HOSPITAL_COMMUNITY): Payer: Self-pay

## 2022-06-03 MED ORDER — DIAZEPAM 5 MG PO TABS
5.0000 mg | ORAL_TABLET | Freq: Two times a day (BID) | ORAL | 0 refills | Status: DC | PRN
  Filled 2022-06-03: qty 15, 8d supply, fill #0

## 2022-06-05 ENCOUNTER — Other Ambulatory Visit (HOSPITAL_COMMUNITY): Payer: Self-pay

## 2022-06-23 ENCOUNTER — Other Ambulatory Visit: Payer: Self-pay

## 2022-06-23 ENCOUNTER — Other Ambulatory Visit (HOSPITAL_COMMUNITY): Payer: Self-pay

## 2022-06-23 MED ORDER — SEMAGLUTIDE-WEIGHT MANAGEMENT 1.7 MG/0.75ML ~~LOC~~ SOAJ
1.7000 mg | SUBCUTANEOUS | 0 refills | Status: DC
  Filled 2022-06-23 – 2022-08-16 (×4): qty 3, 28d supply, fill #0

## 2022-06-23 MED ORDER — SEMAGLUTIDE-WEIGHT MANAGEMENT 1 MG/0.5ML ~~LOC~~ SOAJ
1.0000 mg | SUBCUTANEOUS | 0 refills | Status: DC
  Filled 2022-06-23 – 2022-08-04 (×2): qty 2, 28d supply, fill #0

## 2022-06-23 MED ORDER — SEMAGLUTIDE-WEIGHT MANAGEMENT 2.4 MG/0.75ML ~~LOC~~ SOAJ
2.4000 mg | SUBCUTANEOUS | 0 refills | Status: DC
  Filled 2022-06-23 – 2022-08-16 (×2): qty 3, 28d supply, fill #0

## 2022-06-24 ENCOUNTER — Other Ambulatory Visit (HOSPITAL_COMMUNITY): Payer: Self-pay

## 2022-07-01 ENCOUNTER — Other Ambulatory Visit (HOSPITAL_COMMUNITY): Payer: Self-pay

## 2022-07-16 ENCOUNTER — Other Ambulatory Visit (HOSPITAL_COMMUNITY): Payer: Self-pay

## 2022-07-16 ENCOUNTER — Encounter (HOSPITAL_COMMUNITY): Payer: Self-pay | Admitting: Pharmacist

## 2022-07-22 ENCOUNTER — Telehealth: Payer: Self-pay

## 2022-07-24 ENCOUNTER — Other Ambulatory Visit (HOSPITAL_COMMUNITY): Payer: Self-pay

## 2022-07-26 ENCOUNTER — Other Ambulatory Visit (HOSPITAL_COMMUNITY): Payer: Self-pay

## 2022-07-27 ENCOUNTER — Other Ambulatory Visit (HOSPITAL_COMMUNITY): Payer: Self-pay

## 2022-08-03 ENCOUNTER — Other Ambulatory Visit: Payer: Self-pay

## 2022-08-03 ENCOUNTER — Other Ambulatory Visit (HOSPITAL_COMMUNITY): Payer: Self-pay

## 2022-08-04 ENCOUNTER — Other Ambulatory Visit (HOSPITAL_COMMUNITY): Payer: Self-pay

## 2022-08-16 ENCOUNTER — Other Ambulatory Visit (HOSPITAL_COMMUNITY): Payer: Self-pay

## 2022-08-16 ENCOUNTER — Other Ambulatory Visit: Payer: Self-pay

## 2022-08-18 ENCOUNTER — Other Ambulatory Visit (HOSPITAL_COMMUNITY): Payer: Self-pay

## 2022-08-20 ENCOUNTER — Other Ambulatory Visit (HOSPITAL_COMMUNITY): Payer: Self-pay

## 2022-08-20 MED ORDER — SEMAGLUTIDE-WEIGHT MANAGEMENT 2.4 MG/0.75ML ~~LOC~~ SOAJ
2.4000 mg | SUBCUTANEOUS | 0 refills | Status: DC
  Filled 2022-08-20 – 2022-08-24 (×3): qty 9, 84d supply, fill #0
  Filled 2022-08-26 – 2022-09-10 (×4): qty 3, 28d supply, fill #0
  Filled 2022-09-24 – 2022-10-04 (×2): qty 3, 28d supply, fill #1
  Filled 2022-11-05 – 2022-11-16 (×3): qty 3, 28d supply, fill #2

## 2022-08-20 NOTE — Addendum Note (Signed)
Addended by: Pricilla Holm A on: 08/20/2022 11:48 AM   Modules accepted: Orders

## 2022-08-24 ENCOUNTER — Other Ambulatory Visit (HOSPITAL_COMMUNITY): Payer: Self-pay

## 2022-08-26 ENCOUNTER — Other Ambulatory Visit (HOSPITAL_COMMUNITY): Payer: Self-pay

## 2022-08-27 ENCOUNTER — Other Ambulatory Visit: Payer: Self-pay | Admitting: Internal Medicine

## 2022-08-27 ENCOUNTER — Other Ambulatory Visit (HOSPITAL_COMMUNITY): Payer: Self-pay

## 2022-08-27 MED ORDER — AMLODIPINE BESYLATE 5 MG PO TABS
5.0000 mg | ORAL_TABLET | Freq: Every day | ORAL | 0 refills | Status: DC
  Filled 2022-08-27 – 2022-09-11 (×2): qty 90, 90d supply, fill #0

## 2022-08-27 MED ORDER — ESCITALOPRAM OXALATE 10 MG PO TABS
10.0000 mg | ORAL_TABLET | Freq: Every day | ORAL | 3 refills | Status: DC
  Filled 2022-08-27 – 2022-09-11 (×2): qty 90, 90d supply, fill #0
  Filled 2022-12-02: qty 90, 90d supply, fill #1
  Filled 2023-03-15: qty 90, 90d supply, fill #2

## 2022-08-30 ENCOUNTER — Other Ambulatory Visit (HOSPITAL_COMMUNITY): Payer: Self-pay

## 2022-09-03 ENCOUNTER — Other Ambulatory Visit (HOSPITAL_COMMUNITY): Payer: Self-pay

## 2022-09-06 ENCOUNTER — Other Ambulatory Visit (HOSPITAL_COMMUNITY): Payer: Self-pay

## 2022-09-10 ENCOUNTER — Other Ambulatory Visit (HOSPITAL_COMMUNITY): Payer: Self-pay

## 2022-09-10 ENCOUNTER — Other Ambulatory Visit: Payer: Self-pay

## 2022-09-11 ENCOUNTER — Other Ambulatory Visit (HOSPITAL_COMMUNITY): Payer: Self-pay

## 2022-09-14 ENCOUNTER — Other Ambulatory Visit (HOSPITAL_COMMUNITY): Payer: Self-pay

## 2022-09-14 ENCOUNTER — Other Ambulatory Visit: Payer: Self-pay | Admitting: Internal Medicine

## 2022-09-14 MED ORDER — ATORVASTATIN CALCIUM 40 MG PO TABS
40.0000 mg | ORAL_TABLET | Freq: Every day | ORAL | 0 refills | Status: DC
  Filled 2022-09-14: qty 90, 90d supply, fill #0

## 2022-09-14 MED ORDER — LISINOPRIL 20 MG PO TABS
40.0000 mg | ORAL_TABLET | Freq: Every day | ORAL | 1 refills | Status: DC
  Filled 2022-09-14: qty 180, 90d supply, fill #0
  Filled 2022-11-09 – 2023-01-24 (×2): qty 180, 90d supply, fill #1

## 2022-09-15 ENCOUNTER — Other Ambulatory Visit (HOSPITAL_COMMUNITY): Payer: Self-pay

## 2022-09-24 ENCOUNTER — Other Ambulatory Visit (HOSPITAL_COMMUNITY): Payer: Self-pay

## 2022-09-29 ENCOUNTER — Encounter: Payer: Self-pay | Admitting: Internal Medicine

## 2022-10-09 ENCOUNTER — Other Ambulatory Visit (HOSPITAL_COMMUNITY): Payer: Self-pay

## 2022-11-03 ENCOUNTER — Encounter: Payer: Self-pay | Admitting: Internal Medicine

## 2022-11-08 ENCOUNTER — Other Ambulatory Visit (HOSPITAL_COMMUNITY): Payer: Self-pay

## 2022-11-09 ENCOUNTER — Other Ambulatory Visit: Payer: Self-pay

## 2022-11-09 ENCOUNTER — Other Ambulatory Visit (HOSPITAL_COMMUNITY): Payer: Self-pay

## 2022-11-10 ENCOUNTER — Other Ambulatory Visit: Payer: Self-pay

## 2022-11-16 ENCOUNTER — Other Ambulatory Visit (HOSPITAL_COMMUNITY): Payer: Self-pay

## 2022-11-22 ENCOUNTER — Other Ambulatory Visit (HOSPITAL_COMMUNITY): Payer: Self-pay

## 2022-11-30 NOTE — Progress Notes (Unsigned)
Cardiology Office Note:    Date:  12/01/2022   ID:  Tricia King, DOB [birth year], MRN [id number]  PCP:  Myrlene Broker, MD  [facility] Providers Cardiologist:  Orbie Pyo, MD    Referring MD: Myrlene Broker, *   Chief Complaint:  No chief complaint on file.    Patient Profile:  Coronary artery disease status post CABG consisting of a LIMA to LAD, left radial to obtuse marginal, and vein graft to PDA 2019 at [facility] Hypertension Dyslipidemia Factor 5 Leiden mutation, heterozygous S/P gastric bypass  Cardiac Studies & Procedures     STRESS TESTS  MYOCARDIAL PERFUSION IMAGING 11/12/2021  Narrative   The study is normal. The study is low risk.   No ST deviation was noted.   LV perfusion is normal. There is no evidence of ischemia. There is no evidence of infarction.   Left ventricular function is normal. Nuclear stress EF: 66 %. The left ventricular ejection fraction is hyperdynamic (>65%). End diastolic cavity size is normal. End systolic cavity size is normal.   Prior study not available for comparison.   ECHOCARDIOGRAM  ECHOCARDIOGRAM COMPLETE 11/12/2021  Narrative ECHOCARDIOGRAM REPORT    Patient Name:   Tricia King  Date of Exam: 11/12/2021 Medical Rec #:  [id number]     Height:       66.0 in Accession #:    [id number]    Weight:       212.0 lb Date of Birth:  1961-03-11      BSA:          2.050 m Patient Age:    61 years      BP:           118/80 mmHg Patient Gender: F             HR:           63 bpm. Exam Location:  [address]  Procedure: 2D Echo, 3D Echo, Cardiac Doppler and Color Doppler  Indications:    R07.9 Chest Pain  History:        Patient has no prior history of Echocardiogram examinations. CAD, Prior CABG, Signs/Symptoms:Chest Pain; Risk Factors:Hypertension, Dyslipidemia and Former Smoker.  Sonographer:    Farrel Conners RDCS Referring Phys: Orbie Pyo  IMPRESSIONS   1. Left  ventricular ejection fraction, by estimation, is 50 to 55%. The left ventricle has low normal function. The left ventricle demonstrates regional wall motion abnormalities (see scoring diagram/findings for description). Left ventricular diastolic parameters are consistent with Grade I diastolic dysfunction (impaired relaxation). There is moderate hypokinesis of the left ventricular, basal inferior wall. 2. Right ventricular systolic function is mildly reduced. The right ventricular size is normal. Tricuspid regurgitation signal is inadequate for assessing PA pressure. 3. Left atrial size was mildly dilated. 4. The mitral valve is normal in structure. Mild mitral valve regurgitation. 5. The aortic valve is tricuspid. Aortic valve regurgitation is not visualized. No aortic stenosis is present.  FINDINGS Left Ventricle: Left ventricular ejection fraction, by estimation, is 50 to 55%. The left ventricle has low normal function. The left ventricle demonstrates regional wall motion abnormalities. Moderate hypokinesis of the left ventricular, basal inferior wall. 3D left ventricular ejection fraction analysis performed but not reported based on interpreter judgement due to suboptimal tracking. The left ventricular internal cavity size was small. There is no left ventricular hypertrophy. Abnormal (paradoxical) septal motion consistent with post-operative status. Left ventricular diastolic parameters are consistent with Grade  I diastolic dysfunction (impaired relaxation). Indeterminate filling pressures.  Right Ventricle: The right ventricular size is normal. No increase in right ventricular wall thickness. Right ventricular systolic function is mildly reduced. Tricuspid regurgitation signal is inadequate for assessing PA pressure.  Left Atrium: Left atrial size was mildly dilated.  Right Atrium: Right atrial size was normal in size.  Pericardium: There is no evidence of pericardial effusion.  Mitral  Valve: The mitral valve is normal in structure. Mild mitral valve regurgitation.  Tricuspid Valve: The tricuspid valve is normal in structure. Tricuspid valve regurgitation is trivial.  Aortic Valve: The aortic valve is tricuspid. Aortic valve regurgitation is not visualized. No aortic stenosis is present.  Pulmonic Valve: The pulmonic valve was grossly normal. Pulmonic valve regurgitation is trivial.  Aorta: The aortic root and ascending aorta are structurally normal, with no evidence of dilitation.  IAS/Shunts: No atrial level shunt detected by color flow Doppler.   LEFT VENTRICLE PLAX 2D LVIDd:         4.30 cm   Diastology LVIDs:         3.20 cm   LV e' medial:    5.87 cm/s LV PW:         0.80 cm   LV E/e' medial:  16.0 LV IVS:        0.80 cm   LV e' lateral:   10.30 cm/s LVOT diam:     2.10 cm   LV E/e' lateral: 9.1 LV SV:         69 LV SV Index:   33 LVOT Area:     3.46 cm  3D Volume EF: 3D EF:        72 % LV EDV:       157 ml LV ESV:       44 ml LV SV:        113 ml  RIGHT VENTRICLE RV Basal diam:  3.60 cm RV S prime:     7.62 cm/s TAPSE (M-mode): 0.9 cm  LEFT ATRIUM             Index        RIGHT ATRIUM           Index LA diam:        4.30 cm 2.10 cm/m   RA Area:     18.00 cm LA Vol (A2C):   59.3 ml 28.93 ml/m  RA Volume:   47.00 ml  22.93 ml/m LA Vol (A4C):   65.1 ml 31.76 ml/m LA Biplane Vol: 66.7 ml 32.54 ml/m AORTIC VALVE LVOT Vmax:   83.32 cm/s LVOT Vmean:  56.850 cm/s LVOT VTI:    0.198 m  AORTA Ao Root diam: 3.10 cm Ao Asc diam:  3.40 cm  MITRAL VALVE MV Area (PHT): cm          SHUNTS MV Decel Time: 243 msec     Systemic VTI:  0.20 m MV E velocity: 93.80 cm/s   Systemic Diam: 2.10 cm MV A velocity: 103.00 cm/s MV E/A ratio:  0.91  Mihai Croitoru MD Electronically signed by Thurmon Fair MD Signature Date/Time: 11/12/2021/2:25:57 PM    Final              History of Present Illness:   Tricia King is a 62 y.o. female with the  above problem list.  She was last seen on 07/31/21 by Dr. Lynnette Caffey (establish cardiology care visit). Patient was continued on ASA and beta blocker, initiated on Imdur 30mg   for what was felt to be atypical chest pain (typically after eating, worse laying down). Given suspicion of GI etiology, she was also started on Protonix 40mg . Given hypertension in clinic, patient was advised to take her Amlodipine 5mg  daily (had been taking PRN). She was also started on Atorvastatin 40mg . A Lexiscan stress test and echocardiogram were both ordered at this last visit. LVEF 50-55% with moderate hypokinesis of left ventricular, basal inferior wall. Stress test was without evidence of ischemia or infarction.   Since last office visit, patient reports that she has been doing well in terms of cardiovascular symptoms. She denies any recurrence of chest pain reported at last office visit since completing stress test and echocardiogram and in fact never started the Imdur or Protonix because of symptom improvement. She is also no longer taking Metoprolol. She still has never needed to take a nitroglycerin. Patient is now on a GLP-1 and reports improvement in exertional tolerance with weight loss. She is now able to walk up her driveway which she reports has nearly a 30% incline gradient and is quite long without having to take a break. Her lipid panel following initiation of Atorvastatin shows significant improvement. Patient admits that she does forget to take it ~50% of the time and does however report mild myalgias with current 40mg  dosing.      Past Medical History:  Diagnosis Date   Chicken pox    Diverticulitis    Gestational diabetes    Heart disease    Hyperlipidemia    Hypertension    Current Medications: Current Meds  Medication Sig   acyclovir ointment (ZOVIRAX) 5 % Apply topically every 3 hours. (Patient taking differently: Apply 1 application  topically as needed.)   amLODipine (NORVASC) 5 MG tablet Take 1  tablet (5 mg total) by mouth daily   aspirin 81 MG EC tablet Take 81 mg by mouth daily.   buPROPion (WELLBUTRIN XL) 300 MG 24 hr tablet Take 1 tablet (300 mg total) by mouth daily.   co-enzyme Q-10 30 MG capsule Take 100 mg by mouth daily.   diazepam (VALIUM) 5 MG tablet Take 1 tablet by mouth every 12 hours as needed for anxiety.   escitalopram (LEXAPRO) 10 MG tablet Take 1 tablet (10 mg total) by mouth daily.   ferrous sulfate 325 (65 FE) MG EC tablet Take 1 tablet by mouth daily with breakfast.   ibuprofen (ADVIL) 200 MG tablet Take 600 mg by mouth every 6 (six) hours as needed for headache or moderate pain.   ipratropium (ATROVENT) 0.03 % nasal spray Place 2 sprays into both nostrils 4 (four) times daily as needed for rhinitis.   Krill Oil 500 MG CAPS Take 500 mg by mouth daily.   lisinopril (ZESTRIL) 20 MG tablet Take 2 tablets (40 mg total) by mouth daily.   loratadine (CLARITIN) 10 MG tablet Take 10 mg by mouth daily.   mupirocin cream (BACTROBAN) 2 % Apply 1 application topically 2 (two) times daily.   nitroGLYCERIN (NITROSTAT) 0.4 MG SL tablet Place 1 tablet (0.4 mg total) under the tongue every 5 (five) minutes as needed for chest pain.  If no relief with first dose, call 911.   rosuvastatin (CRESTOR) 20 MG tablet Take 1 tablet (20 mg total) by mouth daily.   Semaglutide-Weight Management 2.4 MG/0.75ML SOAJ Inject 2.4 mg into the skin once a week.   valACYclovir (VALTREX) 1000 MG tablet Take 1 tablet (1,000 mg total) by mouth as needed as directed  Vitamin D3 (VITAMIN D) 25 MCG tablet Take 1,000 Units by mouth daily.   [DISCONTINUED] atorvastatin (LIPITOR) 40 MG tablet Take 1 tablet (40 mg total) by mouth daily.  Stop Zetia (ezetimibe).    Allergies:   Enoxaparin, Heparin (porcine), Other, Sulfa antibiotics, and Sulfamethoxazole   Social History   Occupational History   Not on file  Tobacco Use   Smoking status: Former   Smokeless tobacco: Never  Substance and Sexual Activity    Alcohol use: Yes   Drug use: Never   Sexual activity: Yes    Partners: Male    Family Hx: The patient's family history is not on file.  Review of Systems  Constitutional: Negative for weight gain and weight loss.  Cardiovascular:  Negative for chest pain, dyspnea on exertion, irregular heartbeat, leg swelling, orthopnea, palpitations and syncope.  Respiratory:  Positive for snoring (occasional). Negative for cough and shortness of breath.   All other systems reviewed and are negative.    EKGs/Labs/Other Test Reviewed:    EKG:  EKG is ordered today.  The ekg ordered today demonstrates stable inferior and antero-lateral T wave inversions when compared with prior ECG.   Recent Labs: No results found for requested labs within last 365 days.   Recent Lipid Panel No results for input(s): "CHOL", "TRIG", "HDL", "VLDL", "LDLCALC", "LDLDIRECT" in the last 8760 hours.    Risk Assessment/Calculations/Metrics:              Physical Exam:    VS:  BP 124/84   Pulse 79   Ht 5\' 5"  (1.651 m)   Wt 209 lb (94.8 kg)   SpO2 98%   BMI 34.78 kg/m     Wt Readings from Last 3 Encounters:  12/01/22 209 lb (94.8 kg)  04/07/22 227 lb (103 kg)  02/15/22 228 lb 12.8 oz (103.8 kg)    Constitutional:      Appearance: Healthy appearance. Not in distress.  Neck:     Vascular: No JVR. JVD normal.  Pulmonary:     Effort: Pulmonary effort is normal.     Breath sounds: Normal breath sounds. No wheezing. No rhonchi. No rales.  Chest:     Chest wall: Not tender to palpatation.  Cardiovascular:     PMI at left midclavicular line. Normal rate. Regular rhythm. Normal S1. Normal S2.      Murmurs: There is no murmur.     No gallop.  No click. No rub.  Pulses:    Intact distal pulses.  Edema:    Peripheral edema absent.  Abdominal:     General: Bowel sounds are normal.     Palpations: Abdomen is soft.     Tenderness: There is no abdominal tenderness.  Musculoskeletal: Normal range of motion.         General: No tenderness. Skin:    General: Skin is warm and dry.  Neurological:     General: No focal deficit present.     Mental Status: Alert and oriented to person, place and time.         ASSESSMENT & PLAN:   Dyslipidemia LDL improved to 74 on Atorvastatin 40mg . Still not at goal, ideally less than 50 with CABG hx. However, patient with mild myalgias. Will switch to Crestor 20mg  and repeat lipid panel in one month with patient making concerted effort to not miss doses. If LDL still above, goal and if myalgias resolve, will increase to 40mg .   CAD (coronary artery disease) of bypass graft Patient  without anginal symptoms despite no longer taking Metoprolol/not starting Imdur. 2023 stress test without evidence of ischemia or infarction. ECG with stable TWI today. Reports improving endurance and exertional tolerance in conjunction with weight loss. Continue secondary prevention with statin. Continue ASA 81mg .   Essential hypertension BP at goal, 124/84 today. Continue Amlodipine 5mg , Lisinopril 20mg . Will repeat metabolic panel in 1 month when patient presents for fasting lipid panel.            Dispo:  Return in about 1 year (around 12/01/2023) for Routine follow up in 1 year with Dr. Lynnette Caffey.   Medication Adjustments/Labs and Tests Ordered: Current medicines are reviewed at length with the patient today.  Concerns regarding medicines are outlined above.  Tests Ordered: Orders Placed This Encounter  Procedures   Comp Met (CMET)   Lipid panel   EKG 12-Lead   Medication Changes: Meds ordered this encounter  Medications   rosuvastatin (CRESTOR) 20 MG tablet    Sig: Take 1 tablet (20 mg total) by mouth daily.    Dispense:  90 tablet    Refill:  3   Signed, Perlie Gold, PA-C  12/01/2022 12:45 PM    [facility] [address] [place], [place], [place]  [address] Phone: [phone]; Fax: [phone]

## 2022-12-01 ENCOUNTER — Ambulatory Visit: Payer: 59 | Attending: Cardiology | Admitting: Cardiology

## 2022-12-01 ENCOUNTER — Other Ambulatory Visit (HOSPITAL_COMMUNITY): Payer: Self-pay

## 2022-12-01 ENCOUNTER — Encounter: Payer: Self-pay | Admitting: Cardiology

## 2022-12-01 VITALS — BP 124/84 | HR 79 | Ht 65.0 in | Wt 209.0 lb

## 2022-12-01 DIAGNOSIS — I25708 Atherosclerosis of coronary artery bypass graft(s), unspecified, with other forms of angina pectoris: Secondary | ICD-10-CM | POA: Diagnosis not present

## 2022-12-01 DIAGNOSIS — I10 Essential (primary) hypertension: Secondary | ICD-10-CM | POA: Diagnosis not present

## 2022-12-01 DIAGNOSIS — E785 Hyperlipidemia, unspecified: Secondary | ICD-10-CM

## 2022-12-01 MED ORDER — ROSUVASTATIN CALCIUM 20 MG PO TABS
20.0000 mg | ORAL_TABLET | Freq: Every day | ORAL | 3 refills | Status: DC
  Filled 2022-12-01: qty 90, 90d supply, fill #0

## 2022-12-01 NOTE — Assessment & Plan Note (Signed)
BP at goal, 124/84 today. Continue Amlodipine 5mg , Lisinopril 20mg . Will repeat metabolic panel in 1 month when patient presents for fasting lipid panel.

## 2022-12-01 NOTE — Patient Instructions (Signed)
Medication Instructions:   DISCONTINUE Atorvastatin.  START Rosuvastatin one (1) tablet by mouth (20 mg) every evening.   *If you need a refill on your cardiac medications before your next appointment, please call your pharmacy*   Lab Work:  Your physician recommends that you return for a FASTING lipid profile/cmet on Wednesday, July 17. You can come in on the day of your appointment anytime between 7:30-4:30 fasting from midnight the night before.   \  If you have labs (blood work) drawn today and your tests are completely normal, you will receive your results only by: MyChart Message (if you have MyChart) OR A paper copy in the mail If you have any lab test that is abnormal or we need to change your treatment, we will call you to review the results.   Testing/Procedures:  None ordered.   Follow-Up: At [facility], you and your health needs are our priority.  As part of our continuing mission to provide you with exceptional heart care, we have created designated Provider Care Teams.  These Care Teams include your primary Cardiologist (physician) and Advanced Practice Providers (APPs -  Physician Assistants and Nurse Practitioners) who all work together to provide you with the care you need, when you need it.  We recommend signing up for the patient portal called "MyChart".  Sign up information is provided on this After Visit Summary.  MyChart is used to connect with patients for Virtual Visits (Telemedicine).  Patients are able to view lab/test results, encounter notes, upcoming appointments, etc.  Non-urgent messages can be sent to your provider as well.   To learn more about what you can do with MyChart, go to [email or web address].    Your next appointment:   1 year(s)  Provider:   Orbie Pyo, MD

## 2022-12-01 NOTE — Assessment & Plan Note (Addendum)
Patient without anginal symptoms despite no longer taking Metoprolol/not starting Imdur. 2023 stress test without evidence of ischemia or infarction. ECG with stable TWI today. Reports improving endurance and exertional tolerance in conjunction with weight loss. Continue secondary prevention with statin. Continue ASA 81mg .

## 2022-12-01 NOTE — Assessment & Plan Note (Signed)
LDL improved to 74 on Atorvastatin 40mg . Still not at goal, ideally less than 50 with CABG hx. However, patient with mild myalgias. Will switch to Crestor 20mg  and repeat lipid panel in one month with patient making concerted effort to not miss doses. If LDL still above, goal and if myalgias resolve, will increase to 40mg .

## 2022-12-02 ENCOUNTER — Other Ambulatory Visit: Payer: Self-pay

## 2022-12-02 ENCOUNTER — Other Ambulatory Visit (HOSPITAL_COMMUNITY): Payer: Self-pay

## 2022-12-02 ENCOUNTER — Other Ambulatory Visit: Payer: Self-pay | Admitting: Internal Medicine

## 2022-12-02 MED ORDER — AMLODIPINE BESYLATE 5 MG PO TABS
5.0000 mg | ORAL_TABLET | Freq: Every day | ORAL | 3 refills | Status: DC
  Filled 2022-12-02: qty 90, 90d supply, fill #0
  Filled 2023-03-15: qty 90, 90d supply, fill #1
  Filled 2023-05-31: qty 90, 90d supply, fill #2
  Filled 2023-08-18: qty 90, 90d supply, fill #3

## 2022-12-09 ENCOUNTER — Other Ambulatory Visit: Payer: Self-pay | Admitting: *Deleted

## 2022-12-09 ENCOUNTER — Other Ambulatory Visit: Payer: 59

## 2022-12-09 ENCOUNTER — Other Ambulatory Visit: Payer: Self-pay | Admitting: Cardiology

## 2022-12-09 ENCOUNTER — Other Ambulatory Visit: Payer: Self-pay

## 2022-12-09 DIAGNOSIS — Z79899 Other long term (current) drug therapy: Secondary | ICD-10-CM

## 2022-12-09 DIAGNOSIS — R002 Palpitations: Secondary | ICD-10-CM

## 2022-12-09 DIAGNOSIS — I25708 Atherosclerosis of coronary artery bypass graft(s), unspecified, with other forms of angina pectoris: Secondary | ICD-10-CM

## 2022-12-09 NOTE — Progress Notes (Unsigned)
Enrolled for [employer] to ship a ZIO AT Live Telemetry monitor to patients address on file. It should arrive Friday or Saturday.  Patient would not be able to come to office until next Wednesday.  Dr. Lynnette Caffey to read.

## 2023-01-05 ENCOUNTER — Ambulatory Visit: Payer: 59

## 2023-01-12 ENCOUNTER — Ambulatory Visit: Payer: 59

## 2023-01-18 ENCOUNTER — Encounter: Payer: Self-pay | Admitting: Pharmacist

## 2023-01-18 ENCOUNTER — Other Ambulatory Visit: Payer: Self-pay

## 2023-01-20 ENCOUNTER — Other Ambulatory Visit (HOSPITAL_COMMUNITY): Payer: Self-pay

## 2023-01-21 ENCOUNTER — Other Ambulatory Visit: Payer: Self-pay

## 2023-01-24 ENCOUNTER — Other Ambulatory Visit (HOSPITAL_COMMUNITY): Payer: Self-pay

## 2023-01-24 ENCOUNTER — Other Ambulatory Visit: Payer: Self-pay

## 2023-01-24 ENCOUNTER — Other Ambulatory Visit: Payer: Self-pay | Admitting: Internal Medicine

## 2023-01-24 MED ORDER — DIAZEPAM 5 MG PO TABS
5.0000 mg | ORAL_TABLET | Freq: Two times a day (BID) | ORAL | 0 refills | Status: DC | PRN
  Filled 2023-01-24: qty 15, 8d supply, fill #0

## 2023-01-24 MED ORDER — IPRATROPIUM BROMIDE 0.03 % NA SOLN
2.0000 | Freq: Four times a day (QID) | NASAL | 12 refills | Status: DC | PRN
  Filled 2023-01-24: qty 30, 30d supply, fill #0

## 2023-01-24 MED ORDER — ASPIRIN 81 MG PO TBEC
81.0000 mg | DELAYED_RELEASE_TABLET | Freq: Every day | ORAL | 0 refills | Status: DC
  Filled 2023-01-24: qty 90, 90d supply, fill #0

## 2023-01-24 MED ORDER — FERROUS SULFATE 325 (65 FE) MG PO TBEC
1.0000 | DELAYED_RELEASE_TABLET | Freq: Every day | ORAL | 0 refills | Status: DC
  Filled 2023-01-24: qty 90, 90d supply, fill #0

## 2023-01-24 MED ORDER — COENZYME Q10 30 MG PO CAPS
100.0000 mg | ORAL_CAPSULE | Freq: Every day | ORAL | 0 refills | Status: DC
  Filled 2023-01-24: qty 270, 90d supply, fill #0

## 2023-01-24 MED ORDER — KRILL OIL 500 MG PO CAPS
500.0000 mg | ORAL_CAPSULE | Freq: Every day | ORAL | 0 refills | Status: DC
  Filled 2023-01-24: qty 90, 90d supply, fill #0

## 2023-01-24 MED ORDER — VITAMIN D3 25 MCG PO TABS
1000.0000 [IU] | ORAL_TABLET | Freq: Every day | ORAL | 0 refills | Status: AC
  Filled 2023-01-24: qty 90, 90d supply, fill #0

## 2023-01-26 ENCOUNTER — Ambulatory Visit: Payer: 59 | Attending: Cardiology

## 2023-01-26 DIAGNOSIS — I10 Essential (primary) hypertension: Secondary | ICD-10-CM

## 2023-01-26 DIAGNOSIS — I25708 Atherosclerosis of coronary artery bypass graft(s), unspecified, with other forms of angina pectoris: Secondary | ICD-10-CM | POA: Diagnosis not present

## 2023-01-26 DIAGNOSIS — E785 Hyperlipidemia, unspecified: Secondary | ICD-10-CM | POA: Diagnosis not present

## 2023-01-29 LAB — SPECIMEN STATUS REPORT

## 2023-02-02 ENCOUNTER — Other Ambulatory Visit (HOSPITAL_COMMUNITY): Payer: Self-pay

## 2023-02-02 ENCOUNTER — Other Ambulatory Visit: Payer: Self-pay | Admitting: Physician Assistant

## 2023-02-02 DIAGNOSIS — Z79899 Other long term (current) drug therapy: Secondary | ICD-10-CM

## 2023-02-02 DIAGNOSIS — I25708 Atherosclerosis of coronary artery bypass graft(s), unspecified, with other forms of angina pectoris: Secondary | ICD-10-CM

## 2023-02-02 DIAGNOSIS — E785 Hyperlipidemia, unspecified: Secondary | ICD-10-CM

## 2023-02-02 MED ORDER — ICOSAPENT ETHYL 1 G PO CAPS
2.0000 g | ORAL_CAPSULE | Freq: Two times a day (BID) | ORAL | 11 refills | Status: DC
Start: 2023-02-02 — End: 2024-02-15
  Filled 2023-02-02: qty 120, 30d supply, fill #0
  Filled 2023-03-15: qty 120, 30d supply, fill #1
  Filled 2023-04-25: qty 120, 30d supply, fill #2
  Filled 2023-05-31: qty 120, 30d supply, fill #3
  Filled 2023-07-13: qty 120, 30d supply, fill #4
  Filled 2023-08-17: qty 120, 30d supply, fill #5
  Filled 2023-09-02 – 2023-09-15 (×3): qty 120, 30d supply, fill #6
  Filled 2023-10-05 – 2023-10-10 (×2): qty 120, 30d supply, fill #7
  Filled 2023-11-21: qty 120, 30d supply, fill #8
  Filled 2023-12-21: qty 120, 30d supply, fill #9
  Filled 2024-01-16: qty 120, 30d supply, fill #10

## 2023-02-05 ENCOUNTER — Other Ambulatory Visit (HOSPITAL_COMMUNITY): Payer: Self-pay

## 2023-02-14 ENCOUNTER — Other Ambulatory Visit: Payer: Self-pay

## 2023-02-14 ENCOUNTER — Emergency Department (HOSPITAL_COMMUNITY)
Admission: EM | Admit: 2023-02-14 | Discharge: 2023-02-14 | Disposition: A | Discharge status: Home / Self Care | Payer: 59 | Source: Home / Self Care | Attending: Emergency Medicine | Admitting: Emergency Medicine

## 2023-02-14 ENCOUNTER — Encounter (HOSPITAL_COMMUNITY): Payer: Self-pay

## 2023-02-14 ENCOUNTER — Emergency Department (HOSPITAL_COMMUNITY): Payer: 59

## 2023-02-14 DIAGNOSIS — R0789 Other chest pain: Secondary | ICD-10-CM | POA: Diagnosis not present

## 2023-02-14 DIAGNOSIS — Z7982 Long term (current) use of aspirin: Secondary | ICD-10-CM | POA: Insufficient documentation

## 2023-02-14 DIAGNOSIS — R079 Chest pain, unspecified: Secondary | ICD-10-CM | POA: Insufficient documentation

## 2023-02-14 DIAGNOSIS — I2510 Atherosclerotic heart disease of native coronary artery without angina pectoris: Secondary | ICD-10-CM | POA: Insufficient documentation

## 2023-02-14 DIAGNOSIS — I10 Essential (primary) hypertension: Secondary | ICD-10-CM | POA: Diagnosis not present

## 2023-02-14 DIAGNOSIS — Z79899 Other long term (current) drug therapy: Secondary | ICD-10-CM | POA: Diagnosis not present

## 2023-02-14 LAB — CBC
HCT: 42.5 % (ref 36.0–46.0)
Hemoglobin: 13.9 g/dL (ref 12.0–15.0)
MCH: 29.5 pg (ref 26.0–34.0)
MCHC: 32.7 g/dL (ref 30.0–36.0)
MCV: 90.2 fL (ref 80.0–100.0)
Platelets: 226 10*3/uL (ref 150–400)
RBC: 4.71 MIL/uL (ref 3.87–5.11)
RDW: 14.3 % (ref 11.5–15.5)
WBC: 7.8 10*3/uL (ref 4.0–10.5)
nRBC: 0 % (ref 0.0–0.2)

## 2023-02-14 LAB — BASIC METABOLIC PANEL
Anion gap: 10 (ref 5–15)
BUN: 9 mg/dL (ref 6–20)
CO2: 24 mmol/L (ref 22–32)
Calcium: 8.8 mg/dL — ABNORMAL LOW (ref 8.9–10.3)
Chloride: 101 mmol/L (ref 98–111)
Creatinine, Ser: 0.82 mg/dL (ref 0.44–1.00)
GFR, Estimated: >60 mL/min (ref >60)
Glucose, Bld: 114 mg/dL — ABNORMAL HIGH (ref 70–99)
Potassium: 4.2 mmol/L (ref 3.5–5.1)
Sodium: 135 mmol/L (ref 135–145)

## 2023-02-14 LAB — TROPONIN I (HIGH SENSITIVITY)
Troponin I (High Sensitivity): 5 ng/L (ref <18)
Troponin I (High Sensitivity): 6 ng/L (ref <18)

## 2023-02-14 MED ORDER — NITROGLYCERIN 0.4 MG SL SUBL
0.4000 mg | SUBLINGUAL_TABLET | SUBLINGUAL | Status: DC | PRN
  Administered 2023-02-14: 0.4 mg via SUBLINGUAL
  Filled 2023-02-14: qty 1

## 2023-02-14 NOTE — Discharge Instructions (Signed)
You been evaluated for your symptoms.  Fortunately no concerning findings were noted on today's exam.  Please call and follow-up closely with your cardiologist for reassessment.  Return to the ER if you have any concern.

## 2023-02-14 NOTE — ED Provider Notes (Signed)
[place] [facility] AT [facility] Provider Note   CSN: [id number] Arrival date & time: 02/14/23  1042     History  Chief Complaint  Patient presents with   Chest Pain    Tricia King is a 62 y.o. female.  The history is provided by the patient and medical records. No language interpreter was used.  Chest Pain    62 year old female with history of factor V Leiden not on any blood thinner medication, CAD, obesity, anxiety, depression, gastric bypass, hypertension presenting to ED with complaints of chest pain.  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  She described pain as a pinching stabbing sensation to her left chest, nonradiating however it did concerns her.  She tries taking off all medication as well as 2 baby aspirin but did not notice any significant improvement.  Currently rates her pain as 2 out of 10.  She denies any associated lightheadedness, dizziness, nausea, diaphoresis or shortness of breath.  Pain is nonradiating.  She mentions she had a heart attack in 2019 and at that time her symptom was quite minimal.  Home Medications Prior to Admission medications   Medication Sig Start Date End Date Taking? Authorizing Provider  acyclovir ointment (ZOVIRAX) 5 % Apply topically every 3 hours. Patient taking differently: Apply 1 application  topically as needed. 09/30/21   Myrlene Broker, MD  amLODipine (NORVASC) 5 MG tablet Take 1 tablet (5 mg total) by mouth daily 12/02/22   Orbie Pyo, MD  aspirin EC 81 MG tablet Take 1 tablet (81 mg total) by mouth daily. Annual appt due in Oct must see provider for future refills 01/24/23   Myrlene Broker, MD  buPROPion (WELLBUTRIN XL) 300 MG 24 hr tablet Take 1 tablet (300 mg total) by mouth daily. 04/07/22   Myrlene Broker, MD  co-enzyme Q-10 30 MG capsule Take 3 capsules (90 mg total) by mouth daily. Annual appt due in Oct must see provider for  future refills 01/24/23   Myrlene Broker, MD  diazepam (VALIUM) 5 MG tablet Take 1 tablet by mouth every 12 hours as needed for anxiety. 01/24/23   Myrlene Broker, MD  escitalopram (LEXAPRO) 10 MG tablet Take 1 tablet (10 mg total) by mouth daily. 08/27/22   Myrlene Broker, MD  ferrous sulfate 325 (65 FE) MG EC tablet Take 1 tablet (325 mg total) by mouth daily with breakfast. Annual appt due in Oct must see provider for future refills 01/24/23   Myrlene Broker, MD  ibuprofen (ADVIL) 200 MG tablet Take 600 mg by mouth every 6 (six) hours as needed for headache or moderate pain.    [provider]  icosapent Ethyl (VASCEPA) 1 g capsule Take 2 capsules by mouth 2 times daily. 02/02/23   [facility], Roe Rutherford, PA  ipratropium (ATROVENT) 0.03 % nasal spray Place 2 sprays into both nostrils 4 (four) times daily as needed for rhinitis. 01/24/23   Myrlene Broker, MD  isosorbide mononitrate (IMDUR) 30 MG 24 hr tablet Take 1 tablet (30 mg total) by mouth daily. 07/31/21   Orbie Pyo, MD  lisinopril (ZESTRIL) 20 MG tablet Take 2 tablets (40 mg total) by mouth daily. 09/14/22   Myrlene Broker, MD  loratadine (CLARITIN) 10 MG tablet Take 10 mg by mouth daily.    [provider]  metoprolol tartrate (LOPRESSOR) 25 MG tablet Take 1 tablet (25  mg total) by mouth 2 (two) times daily. 05/29/21   Myrlene Broker, MD  mupirocin cream (BACTROBAN) 2 % Apply 1 application topically 2 (two) times daily. 05/29/21   Myrlene Broker, MD  nitroGLYCERIN (NITROSTAT) 0.4 MG SL tablet Place 1 tablet (0.4 mg total) under the tongue every 5 (five) minutes as needed for chest pain.  If no relief with first dose, call 911. 07/31/21   Orbie Pyo, MD  rosuvastatin (CRESTOR) 20 MG tablet Take 1 tablet (20 mg total) by mouth daily. 12/01/22 03/04/23  Perlie Gold, PA-C  Semaglutide-Weight Management 2.4 MG/0.75ML SOAJ Inject 2.4 mg into the skin once a week. 08/20/22    Myrlene Broker, MD  valACYclovir (VALTREX) 1000 MG tablet Take 1 tablet (1,000 mg total) by mouth as needed as directed 11/06/20   Myrlene Broker, MD  vitamin D3 (CHOLECALCIFEROL) 25 MCG tablet Take 1 tablet (1,000 Units total) by mouth daily. Annual appt due in Oct must see provider for future refills 01/24/23   Myrlene Broker, MD      Allergies    Enoxaparin, Heparin (porcine), Other, Sulfa antibiotics, and Sulfamethoxazole    Review of Systems   Review of Systems  Cardiovascular:  Positive for chest pain.  All other systems reviewed and are negative.   Physical Exam Updated Vital Signs BP (!) 150/91   Pulse 74   Temp 98.6 F (37 C) (Oral)   Resp 16   Ht 5\' 5"  (1.651 m)   Wt 94.8 kg   SpO2 98%   BMI 34.78 kg/m  Physical Exam Vitals and nursing note reviewed.  Constitutional:      General: She is not in acute distress.    Appearance: She is well-developed.  HENT:     Head: Atraumatic.  Eyes:     Conjunctiva/sclera: Conjunctivae normal.  Cardiovascular:     Rate and Rhythm: Normal rate and regular rhythm.     Pulses: Normal pulses.     Heart sounds: Normal heart sounds.  Pulmonary:     Effort: Pulmonary effort is normal.     Breath sounds: No wheezing, rhonchi or rales.  Abdominal:     Palpations: Abdomen is soft.     Tenderness: There is no abdominal tenderness.  Musculoskeletal:     Cervical back: Neck supple.  Skin:    Capillary Refill: Capillary refill takes less than 2 seconds.     Findings: No rash.  Neurological:     Mental Status: She is alert. Mental status is at baseline.  Psychiatric:        Mood and Affect: Mood normal.     ED Results / Procedures / Treatments   Labs (all labs ordered are listed, but only abnormal results are displayed) Labs Reviewed  BASIC METABOLIC PANEL - Abnormal; Notable for the following components:      Result Value   Glucose, Bld 114 (*)    Calcium 8.8 (*)    All other components within normal  limits  CBC  TROPONIN I (HIGH SENSITIVITY)  TROPONIN I (HIGH SENSITIVITY)    EKG None  Radiology DG Chest 2 View  Result Date: 02/14/2023 CLINICAL DATA:  Chest pain. EXAM: CHEST - 2 VIEW COMPARISON:  April 11, 2021. FINDINGS: The heart size and mediastinal contours are within normal limits. Sternotomy wires are noted. Both lungs are clear. The visualized skeletal structures are unremarkable. IMPRESSION: No active cardiopulmonary disease. Electronically Signed   By: Lupita Raider M.D.   On:  02/14/2023 11:59    Procedures Procedures    Medications Ordered in ED Medications  nitroGLYCERIN (NITROSTAT) SL tablet 0.4 mg (0.4 mg Sublingual Given 02/14/23 1205)    ED Course/ Medical Decision Making/ A&P                                 Medical Decision Making Amount and/or Complexity of Data Reviewed Labs: ordered. Radiology: ordered.  Risk Prescription drug management.   BP (!) 150/91   Pulse 74   Temp 98.6 F (37 C) (Oral)   Resp 16   Ht 5\' 5"  (1.651 m)   Wt 94.8 kg   SpO2 98%   BMI 34.78 kg/m   85:50 AM 62 year old female with history of factor V Leiden not on any blood thinner medication, CAD, obesity, anxiety, depression, gastric bypass, hypertension presenting to ED with complaints of chest pain.  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  She described pain as a pinching stabbing sensation to her left chest, nonradiating however it did concerns her.  She tries taking off all medication as well as 2 baby aspirin but did not notice any significant improvement.  Currently rates her pain as 2 out of 10.  She denies any associated lightheadedness, dizziness, nausea, diaphoresis or shortness of breath.  Pain is nonradiating.  She mentions she had a heart attack in 2019 and at that time her symptom was quite minimal.  On exam, patient is resting comfortably in bed appears to be in no acute discomfort.  Heart with normal rate  and rhythm, lungs are clear to auscultation bilaterally abdomen is soft nontender no tenderness to palpation of chest wall.  Patient has intact distal pulses.  Vital signs notable for elevated blood pressure of 150/91.  Patient is afebrile no hypoxia.  Pain is atypical of ACS however due to significant history of cardiac disease, workup initiated.  -Labs ordered, independently viewed and interpreted by me.  Labs remarkable for negative delta troponin, labs overall reassuring -The patient was maintained on a cardiac monitor.  I personally viewed and interpreted the cardiac monitored which showed an underlying rhythm of: Normal sinus rhythm -Imaging independently viewed and interpreted by me and I agree with radiologist's interpretation.  Result remarkable for chest x-ray without acute finding -This patient presents to the ED for concern of chest pain, this involves an extensive number of treatment options, and is a complaint that carries with it a high risk of complications and morbidity.  The differential diagnosis includes muscle strain, GERD, gastritis, costochondritis, MI, PE, dissection, shingles -Co morbidities that complicate the patient evaluation includes CAD status post CABG -Treatment includes sublingual nitro -Reevaluation of the patient after these medicines showed that the patient improved -PCP office notes or outside notes reviewed -Escalation to admission/observation considered: patients feels much better, is comfortable with discharge, and will follow up with PCP -Prescription medication considered, patient comfortable with OTC medication -Social Determinant of Health considered which includes tobacco use   Patient with a heart score of 3.  No active chest pain and prefers to go home.  Pain is atypical therefore we will have patient follow-up with cardiology for outpatient evaluation.  Return precaution given.         Final Clinical Impression(s) / ED Diagnoses Final  diagnoses:  Nonspecific chest pain    Rx / DC Orders ED Discharge Orders  Ordered    Ambulatory referral to Cardiology       Comments: If you have not heard from the Cardiology office within the next 72 hours please call [phone].   02/14/23 1451              Fayrene Helper, PA-C 02/14/23 1452    Wynetta Fines, MD 02/14/23 [phone]

## 2023-02-14 NOTE — ED Triage Notes (Signed)
Pt c/o non radiating left sided chest pain started today. Pt denies N/V, SOB

## 2023-02-15 ENCOUNTER — Telehealth: Payer: Self-pay

## 2023-02-15 NOTE — Transitions of Care (Post Inpatient/ED Visit) (Signed)
02/15/2023  Name: Tricia King MRN: [id number] DOB: Apr 08, 1961  Today's TOC FU Call Status: Today's TOC FU Call Status:: Successful TOC FU Call Completed TOC FU Call Complete Date: 02/15/23 Patient's Name and Date of Birth confirmed.  Transition Care Management Follow-up Telephone Call Date of Discharge: 02/14/23 Discharge Facility: [facility]) Type of Discharge: Emergency Department Reason for ED Visit: Other: (chest pain) How have you been since you were released from the hospital?: Better Any questions or concerns?: No  Items Reviewed: Did you receive and understand the discharge instructions provided?: Yes Medications obtained,verified, and reconciled?: Yes (Medications Reviewed) Any new allergies since your discharge?: No Dietary orders reviewed?: Yes Do you have support at home?: No  Medications Reviewed Today: Medications Reviewed Today     Reviewed by Karena Addison, LPN (Licensed Practical Nurse) on 02/15/23 at 1106  Med List Status: <None>   Medication Order Taking? Sig Documenting Provider Last Dose Status Informant  acyclovir ointment (ZOVIRAX) 5 % [id number] No Apply topically every 3 hours.  Patient taking differently: Apply 1 application  topically as needed.   Myrlene Broker, MD Taking Active   amLODipine (NORVASC) 5 MG tablet [id number]  Take 1 tablet (5 mg total) by mouth daily Orbie Pyo, MD  Active   aspirin EC 81 MG tablet [id number]  Take 1 tablet (81 mg total) by mouth daily. Annual appt due in Oct must see provider for future refills Myrlene Broker, MD  Active   buPROPion (WELLBUTRIN XL) 300 MG 24 hr tablet [id number] No Take 1 tablet (300 mg total) by mouth daily. Myrlene Broker, MD Taking Active   co-enzyme Q-10 30 MG capsule [id number]  Take 3 capsules (90 mg total) by mouth daily. Annual appt due in Oct must see provider for future refills Myrlene Broker, MD  Active   diazepam (VALIUM) 5 MG tablet [id number]  Take 1  tablet by mouth every 12 hours as needed for anxiety. Myrlene Broker, MD  Active   escitalopram (LEXAPRO) 10 MG tablet [id number] No Take 1 tablet (10 mg total) by mouth daily. Myrlene Broker, MD Taking Active   ferrous sulfate 325 (65 FE) MG EC tablet [id number]  Take 1 tablet (325 mg total) by mouth daily with breakfast. Annual appt due in Oct must see provider for future refills Myrlene Broker, MD  Active   ibuprofen (ADVIL) 200 MG tablet [id number] No Take 600 mg by mouth every 6 (six) hours as needed for headache or moderate pain. [provider] Taking Active Self  icosapent Ethyl (VASCEPA) 1 g capsule [id number]  Take 2 capsules by mouth 2 times daily. Marcelino Duster, PA  Active   ipratropium (ATROVENT) 0.03 % nasal spray [id number]  Place 2 sprays into both nostrils 4 (four) times daily as needed for rhinitis. Myrlene Broker, MD  Active   isosorbide mononitrate (IMDUR) 30 MG 24 hr tablet [id number] No Take 1 tablet (30 mg total) by mouth daily. Orbie Pyo, MD Taking Active   lisinopril (ZESTRIL) 20 MG tablet [id number] No Take 2 tablets (40 mg total) by mouth daily. Myrlene Broker, MD Taking Active   loratadine (CLARITIN) 10 MG tablet [id number] No Take 10 mg by mouth daily. [provider] Taking Active Self  metoprolol tartrate (LOPRESSOR) 25 MG tablet [id number] No Take 1 tablet (25 mg total) by mouth 2 (two) times daily. Myrlene Broker, MD Taking Active   mupirocin cream (BACTROBAN) 2 % [id number] No Apply  1 application topically 2 (two) times daily. Myrlene Broker, MD Taking Active   nitroGLYCERIN (NITROSTAT) 0.4 MG SL tablet [id number] No Place 1 tablet (0.4 mg total) under the tongue every 5 (five) minutes as needed for chest pain.  If no relief with first dose, call 911. Orbie Pyo, MD Taking Active   rosuvastatin (CRESTOR) 20 MG tablet [id number]  Take 1 tablet (20 mg total) by mouth daily. Perlie Gold, PA-C   Active   Semaglutide-Weight Management 2.4 MG/0.75ML SOAJ [id number] No Inject 2.4 mg into the skin once a week. Myrlene Broker, MD Taking Active   valACYclovir (VALTREX) 1000 MG tablet [id number] No Take 1 tablet (1,000 mg total) by mouth as needed as directed Myrlene Broker, MD Taking Active Self  vitamin D3 (CHOLECALCIFEROL) 25 MCG tablet [id number]  Take 1 tablet (1,000 Units total) by mouth daily. Annual appt due in Oct must see provider for future refills Myrlene Broker, MD  Active             Home Care and Equipment/Supplies: Were Home Health Services Ordered?: NA Any new equipment or medical supplies ordered?: NA  Functional Questionnaire: Do you need assistance with bathing/showering or dressing?: No Do you need assistance with meal preparation?: No Do you need assistance with eating?: No Do you have difficulty maintaining continence: No Do you need assistance with getting out of bed/getting out of a chair/moving?: No Do you have difficulty managing or taking your medications?: No  Follow up appointments reviewed: PCP Follow-up appointment confirmed?: NA Specialist Hospital Follow-up appointment confirmed?: No Reason Specialist Follow-Up Not Confirmed: Patient has Specialist Provider Number and will Call for Appointment Do you need transportation to your follow-up appointment?: No Do you understand care options if your condition(s) worsen?: Yes-patient verbalized understanding    SIGNATURE Karena Addison, LPN [facility] Nurse Health Advisor Direct Dial [phone]

## 2023-03-15 ENCOUNTER — Other Ambulatory Visit: Payer: Self-pay | Admitting: Internal Medicine

## 2023-03-16 ENCOUNTER — Ambulatory Visit: Payer: 59 | Admitting: Nurse Practitioner

## 2023-03-16 ENCOUNTER — Other Ambulatory Visit (HOSPITAL_COMMUNITY): Payer: Self-pay

## 2023-03-21 ENCOUNTER — Other Ambulatory Visit (HOSPITAL_COMMUNITY): Payer: Self-pay

## 2023-03-21 MED ORDER — ATORVASTATIN CALCIUM 40 MG PO TABS
40.0000 mg | ORAL_TABLET | Freq: Every day | ORAL | 3 refills | Status: DC
  Filled 2023-03-21 – 2023-03-28 (×2): qty 90, 90d supply, fill #0

## 2023-03-23 DIAGNOSIS — H524 Presbyopia: Secondary | ICD-10-CM | POA: Diagnosis not present

## 2023-03-24 ENCOUNTER — Encounter (HOSPITAL_COMMUNITY): Payer: Self-pay

## 2023-03-27 ENCOUNTER — Other Ambulatory Visit (HOSPITAL_COMMUNITY): Payer: Self-pay

## 2023-03-28 ENCOUNTER — Other Ambulatory Visit (HOSPITAL_COMMUNITY): Payer: Self-pay

## 2023-03-30 ENCOUNTER — Other Ambulatory Visit (HOSPITAL_COMMUNITY): Payer: Self-pay

## 2023-03-30 ENCOUNTER — Other Ambulatory Visit: Payer: Self-pay

## 2023-04-01 ENCOUNTER — Other Ambulatory Visit (HOSPITAL_COMMUNITY): Payer: Self-pay

## 2023-04-13 ENCOUNTER — Ambulatory Visit: Payer: 59

## 2023-04-20 ENCOUNTER — Other Ambulatory Visit (HOSPITAL_COMMUNITY): Payer: Self-pay

## 2023-04-20 ENCOUNTER — Ambulatory Visit (INDEPENDENT_AMBULATORY_CARE_PROVIDER_SITE_OTHER): Payer: 59 | Admitting: Internal Medicine

## 2023-04-20 ENCOUNTER — Other Ambulatory Visit: Payer: Self-pay

## 2023-04-20 ENCOUNTER — Encounter: Payer: Self-pay | Admitting: Internal Medicine

## 2023-04-20 VITALS — BP 122/80 | HR 76 | Temp 98.5°F | Ht 65.0 in | Wt 203.0 lb

## 2023-04-20 DIAGNOSIS — I25708 Atherosclerosis of coronary artery bypass graft(s), unspecified, with other forms of angina pectoris: Secondary | ICD-10-CM | POA: Diagnosis not present

## 2023-04-20 DIAGNOSIS — I10 Essential (primary) hypertension: Secondary | ICD-10-CM | POA: Diagnosis not present

## 2023-04-20 DIAGNOSIS — Z0000 Encounter for general adult medical examination without abnormal findings: Secondary | ICD-10-CM | POA: Diagnosis not present

## 2023-04-20 DIAGNOSIS — F3342 Major depressive disorder, recurrent, in full remission: Secondary | ICD-10-CM

## 2023-04-20 DIAGNOSIS — E785 Hyperlipidemia, unspecified: Secondary | ICD-10-CM

## 2023-04-20 DIAGNOSIS — B001 Herpesviral vesicular dermatitis: Secondary | ICD-10-CM

## 2023-04-20 DIAGNOSIS — D6851 Activated protein C resistance: Secondary | ICD-10-CM

## 2023-04-20 DIAGNOSIS — Z9884 Bariatric surgery status: Secondary | ICD-10-CM

## 2023-04-20 MED ORDER — IPRATROPIUM BROMIDE 0.03 % NA SOLN
2.0000 | Freq: Four times a day (QID) | NASAL | 12 refills | Status: AC | PRN
  Filled 2023-04-20: qty 30, 21d supply, fill #0
  Filled 2024-03-26: qty 30, 21d supply, fill #1

## 2023-04-20 MED ORDER — ACYCLOVIR 5 % EX OINT
1.0000 | TOPICAL_OINTMENT | CUTANEOUS | 11 refills | Status: DC
  Filled 2023-04-20: qty 15, 2d supply, fill #0

## 2023-04-20 MED ORDER — ATORVASTATIN CALCIUM 40 MG PO TABS
40.0000 mg | ORAL_TABLET | Freq: Every day | ORAL | 3 refills | Status: DC
  Filled 2023-04-20 – 2023-07-13 (×2): qty 90, 90d supply, fill #0
  Filled 2023-09-02 – 2023-11-21 (×2): qty 90, 90d supply, fill #1
  Filled 2024-02-15: qty 90, 90d supply, fill #2

## 2023-04-20 MED ORDER — VALACYCLOVIR HCL 1 G PO TABS
1000.0000 mg | ORAL_TABLET | ORAL | 3 refills | Status: DC | PRN
  Filled 2023-04-20: qty 90, 90d supply, fill #0

## 2023-04-20 MED ORDER — MUPIROCIN CALCIUM 2 % EX CREA
1.0000 | TOPICAL_CREAM | Freq: Two times a day (BID) | CUTANEOUS | 0 refills | Status: AC
  Filled 2023-04-20: qty 15, 8d supply, fill #0

## 2023-04-20 MED ORDER — METOPROLOL TARTRATE 25 MG PO TABS
25.0000 mg | ORAL_TABLET | Freq: Two times a day (BID) | ORAL | 3 refills | Status: DC
  Filled 2023-04-20: qty 180, 90d supply, fill #0

## 2023-04-20 MED ORDER — ESCITALOPRAM OXALATE 10 MG PO TABS
10.0000 mg | ORAL_TABLET | Freq: Every day | ORAL | 3 refills | Status: DC
  Filled 2023-04-20 – 2023-05-31 (×4): qty 90, 90d supply, fill #0
  Filled 2023-08-18: qty 90, 90d supply, fill #1
  Filled 2023-12-06: qty 90, 90d supply, fill #2
  Filled 2024-02-15 – 2024-02-21 (×2): qty 90, 90d supply, fill #3

## 2023-04-20 MED ORDER — LISINOPRIL 20 MG PO TABS
40.0000 mg | ORAL_TABLET | Freq: Every day | ORAL | 3 refills | Status: DC
  Filled 2023-04-20: qty 180, 90d supply, fill #0
  Filled 2023-08-18: qty 180, 90d supply, fill #1
  Filled 2023-11-21: qty 180, 90d supply, fill #2
  Filled 2024-02-15: qty 180, 90d supply, fill #3

## 2023-04-20 MED ORDER — BUPROPION HCL ER (XL) 300 MG PO TB24
300.0000 mg | ORAL_TABLET | Freq: Every day | ORAL | 3 refills | Status: DC
  Filled 2023-04-20: qty 90, 90d supply, fill #0
  Filled 2023-07-14: qty 90, 90d supply, fill #1
  Filled 2023-08-18 – 2023-10-05 (×3): qty 90, 90d supply, fill #2
  Filled 2023-12-21 – 2024-01-15 (×3): qty 90, 90d supply, fill #3

## 2023-04-20 MED ORDER — ASPIRIN 81 MG PO TBEC
81.0000 mg | DELAYED_RELEASE_TABLET | Freq: Every day | ORAL | 3 refills | Status: AC
  Filled 2023-04-20: qty 90, 90d supply, fill #0

## 2023-04-20 MED ORDER — COENZYME Q10 30 MG PO CAPS
100.0000 mg | ORAL_CAPSULE | Freq: Every day | ORAL | 3 refills | Status: AC
  Filled 2023-04-20: qty 270, 90d supply, fill #0

## 2023-04-20 MED ORDER — FERROUS SULFATE 325 (65 FE) MG PO TBEC
1.0000 | DELAYED_RELEASE_TABLET | Freq: Every day | ORAL | 3 refills | Status: AC
  Filled 2023-04-20: qty 100, 100d supply, fill #0

## 2023-04-20 MED ORDER — SEMAGLUTIDE-WEIGHT MANAGEMENT 2.4 MG/0.75ML ~~LOC~~ SOAJ
2.4000 mg | SUBCUTANEOUS | 3 refills | Status: AC
  Filled 2023-04-20: qty 9, 84d supply, fill #0

## 2023-04-20 NOTE — Progress Notes (Signed)
   Subjective:   Patient ID: Tricia King, female    DOB: [birth year], 62 y.o.   MRN: [id number]  HPI The patient is here for physical.  PMH, [facility], social history reviewed and updated  Review of Systems  Constitutional: Negative.   HENT: Negative.    Eyes: Negative.   Respiratory:  Negative for cough, chest tightness and shortness of breath.   Cardiovascular:  Negative for chest pain, palpitations and leg swelling.  Gastrointestinal:  Negative for abdominal distention, abdominal pain, constipation, diarrhea, nausea and vomiting.  Musculoskeletal: Negative.   Skin: Negative.   Neurological: Negative.   Psychiatric/Behavioral: Negative.      Objective:  Physical Exam Constitutional:      Appearance: She is well-developed.  HENT:     Head: Normocephalic and atraumatic.  Cardiovascular:     Rate and Rhythm: Normal rate and regular rhythm.  Pulmonary:     Effort: Pulmonary effort is normal. No respiratory distress.     Breath sounds: Normal breath sounds. No wheezing or rales.  Abdominal:     General: Bowel sounds are normal. There is no distension.     Palpations: Abdomen is soft.     Tenderness: There is no abdominal tenderness. There is no rebound.  Musculoskeletal:     Cervical back: Normal range of motion.  Skin:    General: Skin is warm and dry.  Neurological:     Mental Status: She is alert and oriented to person, place, and time.     Coordination: Coordination normal.     Vitals:   04/20/23 1042  BP: 122/80  Pulse: 76  Temp: 98.5 F (36.9 C)  TempSrc: Oral  SpO2: 96%  Weight: 203 lb (92.1 kg)  Height: 5\' 5"  (1.651 m)    Assessment & Plan:

## 2023-04-22 NOTE — Assessment & Plan Note (Signed)
No signs of blood clot and we will be cautious with any surgical procedure.

## 2023-04-22 NOTE — Assessment & Plan Note (Signed)
Uses valtrex prn and continue.

## 2023-04-22 NOTE — Assessment & Plan Note (Signed)
Flu shot yearly. Shingrix counseled. Tetanus counseled. Colonoscopy up to date. Mammogram up to date, pap smear up to date. Counseled about sun safety and mole surveillance. Counseled about the dangers of distracted driving. Given 10 year screening recommendations.

## 2023-04-22 NOTE — Assessment & Plan Note (Signed)
Controlled anginal symptoms taking imdur 30 mg daily.

## 2023-04-22 NOTE — Assessment & Plan Note (Signed)
Recent labs taking multivitamin. Needs yearly B12 and vitamin D and CBC and CMP.

## 2023-04-22 NOTE — Assessment & Plan Note (Signed)
Taking atorvastatin 40 mg daily and recent labs at goal.

## 2023-04-22 NOTE — Assessment & Plan Note (Signed)
Taking wellbutrin 300 mg daily and valium 5 mg prn and lexapro 10 mg daily and overall well controlled no changes needed will maintain today.

## 2023-04-22 NOTE — Assessment & Plan Note (Signed)
BP at goal on lisinopril 40 mg daily and metoprolol 25 mg BID and imdur 30 mg daily and amlodipine 5 mg daily.

## 2023-04-25 ENCOUNTER — Other Ambulatory Visit (HOSPITAL_COMMUNITY): Payer: Self-pay

## 2023-04-26 ENCOUNTER — Other Ambulatory Visit (HOSPITAL_COMMUNITY): Payer: Self-pay

## 2023-05-11 ENCOUNTER — Encounter: Payer: Self-pay | Admitting: Internal Medicine

## 2023-05-11 DIAGNOSIS — K649 Unspecified hemorrhoids: Secondary | ICD-10-CM

## 2023-05-23 ENCOUNTER — Other Ambulatory Visit (HOSPITAL_COMMUNITY): Payer: Self-pay

## 2023-05-23 ENCOUNTER — Encounter (HOSPITAL_COMMUNITY): Payer: Self-pay

## 2023-05-23 ENCOUNTER — Telehealth: Payer: 59 | Admitting: Physician Assistant

## 2023-05-23 DIAGNOSIS — H6690 Otitis media, unspecified, unspecified ear: Secondary | ICD-10-CM | POA: Diagnosis not present

## 2023-05-23 DIAGNOSIS — J069 Acute upper respiratory infection, unspecified: Secondary | ICD-10-CM

## 2023-05-23 DIAGNOSIS — H6992 Unspecified Eustachian tube disorder, left ear: Secondary | ICD-10-CM | POA: Diagnosis not present

## 2023-05-23 MED ORDER — AMOXICILLIN 875 MG PO TABS
875.0000 mg | ORAL_TABLET | Freq: Two times a day (BID) | ORAL | 0 refills | Status: AC
Start: 2023-05-23 — End: 2023-06-02
  Filled 2023-05-23 (×2): qty 20, 10d supply, fill #0

## 2023-05-23 NOTE — Patient Instructions (Signed)
Baker Janus, thank you for joining Piedad Climes, PA-C for today's virtual visit.  While this provider is not your primary care provider (PCP), if your PCP is located in our provider database this encounter information will be shared with them immediately following your visit.   A [facility] MyChart account gives you access to today's visit and all your visits, tests, and labs performed at [facility] " click here if you don't have a [facility] MyChart account or go to [email or web address]  Consent: (Patient) Renie Tenny provided verbal consent for this virtual visit at the beginning of the encounter.  Current Medications:  Current Outpatient Medications:    acyclovir ointment (ZOVIRAX) 5 %, Apply topically every 3 hours., Disp: 15 g, Rfl: 11   amLODipine (NORVASC) 5 MG tablet, Take 1 tablet (5 mg total) by mouth daily, Disp: 90 tablet, Rfl: 3   aspirin EC 81 MG tablet, Take 1 tablet (81 mg total) by mouth daily. Annual appt due in Oct must see provider for future refills, Disp: 90 tablet, Rfl: 3   atorvastatin (LIPITOR) 40 MG tablet, Take 1 tablet (40 mg total) by mouth daily., Disp: 90 tablet, Rfl: 3   buPROPion (WELLBUTRIN XL) 300 MG 24 hr tablet, Take 1 tablet (300 mg total) by mouth daily., Disp: 90 tablet, Rfl: 3   co-enzyme Q-10 30 MG capsule, Take 3 capsules (90 mg total) by mouth daily. Annual appt due in Oct must see provider for future refills, Disp: 270 capsule, Rfl: 3   diazepam (VALIUM) 5 MG tablet, Take 1 tablet by mouth every 12 hours as needed for anxiety., Disp: 15 tablet, Rfl: 0   escitalopram (LEXAPRO) 10 MG tablet, Take 1 tablet (10 mg total) by mouth daily., Disp: 90 tablet, Rfl: 3   ferrous sulfate 325 (65 FE) MG EC tablet, Take 1 tablet (325 mg total) by mouth daily with breakfast., Disp: 100 tablet, Rfl: 3   ibuprofen (ADVIL) 200 MG tablet, Take 600 mg by mouth every 6 (six) hours as needed for headache or moderate pain., Disp: , Rfl:     icosapent Ethyl (VASCEPA) 1 g capsule, Take 2 capsules by mouth 2 times daily., Disp: 120 capsule, Rfl: 11   ipratropium (ATROVENT) 0.03 % nasal spray, Place 2 sprays into both nostrils 4 (four) times daily as needed for rhinitis., Disp: 30 mL, Rfl: 12   isosorbide mononitrate (IMDUR) 30 MG 24 hr tablet, Take 1 tablet (30 mg total) by mouth daily., Disp: 90 tablet, Rfl: 3   lisinopril (ZESTRIL) 20 MG tablet, Take 2 tablets (40 mg total) by mouth daily., Disp: 180 tablet, Rfl: 3   loratadine (CLARITIN) 10 MG tablet, Take 10 mg by mouth daily., Disp: , Rfl:    metoprolol tartrate (LOPRESSOR) 25 MG tablet, Take 1 tablet (25 mg total) by mouth 2 (two) times daily., Disp: 180 tablet, Rfl: 3   mupirocin cream (BACTROBAN) 2 %, Apply 1 Application topically 2 (two) times daily., Disp: 15 g, Rfl: 0   nitroGLYCERIN (NITROSTAT) 0.4 MG SL tablet, Place 1 tablet (0.4 mg total) under the tongue every 5 (five) minutes as needed for chest pain.  If no relief with first dose, call 911., Disp: 25 tablet, Rfl: 6   Semaglutide-Weight Management 2.4 MG/0.75ML SOAJ, Inject 2.4 mg into the skin once a week., Disp: 9 mL, Rfl: 3   valACYclovir (VALTREX) 1000 MG tablet, Take 1 tablet (1,000 mg total) by mouth as needed as directed, Disp: 90 tablet, Rfl: 3  vitamin D3 (CHOLECALCIFEROL) 25 MCG tablet, Take 1 tablet (1,000 Units total) by mouth daily. Annual appt due in Oct must see provider for future refills, Disp: 90 tablet, Rfl: 0   Medications ordered in this encounter:  No orders of the defined types were placed in this encounter.    *If you need refills on other medications prior to your next appointment, please contact your pharmacy*  Follow-Up: Call back or seek an in-person evaluation if the symptoms worsen or if the condition fails to improve as anticipated.  [facility] [phone]  Other Instructions Upper Respiratory Infection, Adult An upper respiratory infection (URI) affects the nose,  throat, and upper airways that lead to the lungs. The most common type of URI is often called the common cold. URIs usually get better on their own, without medical treatment. What are the causes? A URI is caused by a germ (virus). You may catch these germs by: Breathing in droplets from an infected person's cough or sneeze. Touching something that has the germ on it (is contaminated) and then touching your mouth, nose, or eyes. What increases the risk? You are more likely to get a URI if: You are very young or very old. You have close contact with others, such as at work, school, or a health care facility. You smoke. You have long-term (chronic) heart or lung disease. You have a weakened disease-fighting system (immune system). You have nasal allergies or asthma. You have a lot of stress. You have poor nutrition. What are the signs or symptoms? Runny or stuffy (congested) nose. Cough. Sneezing. Sore throat. Headache. Feeling tired (fatigue). Fever. Not wanting to eat as much as usual. Pain in your forehead, behind your eyes, and over your cheekbones (sinus pain). Muscle aches. Redness or irritation of the eyes. Pressure in the ears or face. How is this treated? URIs usually get better on their own within 7-10 days. Medicines cannot cure URIs, but your doctor may recommend certain medicines to help relieve symptoms, such as: Over-the-counter cold medicines. Medicines to reduce coughing (cough suppressants). Coughing is a type of defense against infection that helps to clear the nose, throat, windpipe, and lungs (respiratory system). Take these medicines only as told by your doctor. Medicines to lower your fever. Follow these instructions at home: Activity Rest as needed. If you have a fever, stay home from work or school until your fever is gone, or until your doctor says you may return to work or school. You should stay home until you cannot spread the infection anymore (you are  not contagious). Your doctor may have you wear a face mask so you have less risk of spreading the infection. Relieving symptoms Rinse your mouth often with salt water. To make salt water, dissolve -1 tsp (3-6 g) of salt in 1 cup (237 mL) of warm water. Use a cool-mist humidifier to add moisture to the air. This can help you breathe more easily. Eating and drinking  Drink enough fluid to keep your pee (urine) pale yellow. Eat soups and other clear broths. General instructions  Take over-the-counter and prescription medicines only as told by your doctor. Do not smoke or use any products that contain nicotine or tobacco. If you need help quitting, ask your doctor. Avoid being where people are smoking (avoid secondhand smoke). Stay up to date on all your shots (immunizations), and get the flu shot every year. Keep all follow-up visits. How to prevent the spread of infection to others  [employer]  your hands with soap and water for at least 20 seconds. If you cannot use soap and water, use hand sanitizer. Avoid touching your mouth, face, eyes, or nose. Cough or sneeze into a tissue or your sleeve or elbow. Do not cough or sneeze into your hand or into the air. Contact a doctor if: You are getting worse, not better. You have any of these: A fever or chills. Brown or red mucus in your nose. Yellow or brown fluid (discharge)coming from your nose. Pain in your face, especially when you bend forward. Swollen neck glands. Pain when you swallow. White areas in the back of your throat. Get help right away if: You have shortness of breath that gets worse. You have very bad or constant: Headache. Ear pain. Pain in your forehead, behind your eyes, and over your cheekbones (sinus pain). Chest pain. You have long-lasting (chronic) lung disease along with any of these: Making high-pitched whistling sounds when you breathe, most often when you breathe out (wheezing). Long-lasting cough (more than 14  days). Coughing up blood. A change in your usual mucus. You have a stiff neck. You have changes in your: Vision. Hearing. Thinking. Mood. These symptoms may be an emergency. Get help right away. Call 911. Do not wait to see if the symptoms will go away. Do not drive yourself to the hospital. Summary An upper respiratory infection (URI) is caused by a germ (virus). The most common type of URI is often called the common cold. URIs usually get better within 7-10 days. Take over-the-counter and prescription medicines only as told by your doctor. This information is not intended to replace advice given to you by your health care provider. Make sure you discuss any questions you have with your health care provider. Document Revised: 01/07/2021 Document Reviewed: 01/07/2021 Elsevier Patient Education  2024 Elsevier Inc.   Otitis Media, Adult  Otitis media is a condition in which the middle ear is red and swollen (inflamed) and full of fluid. The middle ear is the part of the ear that contains bones for hearing as well as air that helps send sounds to the brain. The condition usually goes away on its own. What are the causes? This condition is caused by a blockage in the eustachian tube. This tube connects the middle ear to the back of the nose. It normally allows air into the middle ear. The blockage is caused by fluid or swelling. Problems that can cause blockage include: A cold or infection that affects the nose, mouth, or throat. Allergies. An irritant, such as tobacco smoke. Adenoids that have become large. The adenoids are soft tissue located in the back of the throat, behind the nose and the roof of the mouth. Growth or swelling in the upper part of the throat, just behind the nose (nasopharynx). Damage to the ear caused by a change in pressure. This is called barotrauma. What increases the risk? You are more likely to develop this condition if you: Smoke or are exposed to tobacco  smoke. Have an opening in the roof of your mouth (cleft palate). Have acid reflux. Have problems in your body's defense system (immune system). What are the signs or symptoms? Symptoms of this condition include: Ear pain. Fever. Problems with hearing. Being tired. Fluid leaking from the ear. Ringing in the ear. How is this treated? This condition can go away on its own within 3-5 days. But if the condition is caused by germs (bacteria) and does not go away on its  own, or if it keeps coming back, your doctor may: Give you antibiotic medicines. Give you medicines for pain. Follow these instructions at home: Take over-the-counter and prescription medicines only as told by your doctor. If you were prescribed an antibiotic medicine, take it as told by your doctor. Do not stop taking it even if you start to feel better. Keep all follow-up visits. Contact a doctor if: You have bleeding from your nose. There is a lump on your neck. You are not feeling better in 5 days. You feel worse instead of better. Get help right away if: You have pain that is not helped with medicine. You have swelling, redness, or pain around your ear. You get a stiff neck. You cannot move part of your face (paralysis). You notice that the bone behind your ear hurts when you touch it. You get a very bad headache. Summary Otitis media means that the middle ear is red, swollen, and full of fluid. This condition usually goes away on its own. If the problem does not go away, treatment may be needed. You may be given medicines to treat the infection or to treat your pain. If you were prescribed an antibiotic medicine, take it as told by your doctor. Do not stop taking it even if you start to feel better. Keep all follow-up visits. This information is not intended to replace advice given to you by your health care provider. Make sure you discuss any questions you have with your health care provider. Document Revised:  09/15/2020 Document Reviewed: 09/15/2020 Elsevier Patient Education  2024 Elsevier Inc.   If you have been instructed to have an in-person evaluation today at a local Urgent Care facility, please use the link below. It will take you to a list of all of our available [facility], including address, phone number and hours of operation. Please do not delay care.  [facility]  If you or a family member do not have a primary care provider, use the link below to schedule a visit and establish care. When you choose a [facility] primary care physician or advanced practice provider, you gain a long-term partner in health. Find a Primary Care Provider  Learn more about [facility] in-office and virtual care options: [facility] - Get Care Now

## 2023-05-23 NOTE — Progress Notes (Signed)
Virtual Visit Consent   Tricia King, you are scheduled for a virtual visit with a [facility] provider today. Just as with appointments in the office, your consent must be obtained to participate. Your consent will be active for this visit and any virtual visit you may have with one of our providers in the next 365 days. If you have a MyChart account, a copy of this consent can be sent to you electronically.  As this is a virtual visit, video technology does not allow for your provider to perform a traditional examination. This may limit your provider's ability to fully assess your condition. If your provider identifies any concerns that need to be evaluated in person or the need to arrange testing (such as labs, EKG, etc.), we will make arrangements to do so. Although advances in technology are sophisticated, we cannot ensure that it will always work on either your end or our end. If the connection with a video visit is poor, the visit may have to be switched to a telephone visit. With either a video or telephone visit, we are not always able to ensure that we have a secure connection.  By engaging in this virtual visit, you consent to the provision of healthcare and authorize for your insurance to be billed (if applicable) for the services provided during this visit. Depending on your insurance coverage, you may receive a charge related to this service.  I need to obtain your verbal consent now. Are you willing to proceed with your visit today? Tricia King has provided verbal consent on 05/23/2023 for a virtual visit (video or telephone). Tricia King, [place]  Date: 05/23/2023 8:51 AM  Virtual Visit via Video Note   I, Tricia King, connected with  Tricia King  ([id number], Nov 18, 1960) on 05/23/23 at  8:45 AM EST by a video-enabled telemedicine application and verified that I am speaking with the correct person using two identifiers.  Location: Patient: Virtual Visit Location Patient:  Home Provider: Virtual Visit Location Provider: Home Office   I discussed the limitations of evaluation and management by telemedicine and the availability of in person appointments. The patient expressed understanding and agreed to proceed.    History of Present Illness: Tricia King is a 62 y.o. who identifies as a female who was assigned female at birth, and is being seen today for a few days of nasal congestion, rhinorrhea and sinus pressure. Initially with pressure in the L ear that has now progressed to ear pain that is intermittent but worse when trying to blow her nose. Also some associated muffled hearing in that ear. Denies fever, chills. Denies drainage from ear. Denies tinnitus. No symptoms of R ear. Marland Kitchen  OTC -- Atrovent nasal spray, Flonase. Allertec (cetirizine).   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   Dyslipidemia 06/19/2018   Essential hypertension 05/10/2018   Factor 5 Leiden mutation, heterozygous (HCC) 05/10/2018    Allergies:  Allergies  Allergen Reactions   Enoxaparin Other (See Comments)    Welts    Heparin (Porcine) Other (See Comments)    welts   Other Anaphylaxis  LOBSTER   Rosuvastatin     headaches   Sulfa Antibiotics Nausea And Vomiting   Sulfamethoxazole Nausea And Vomiting   Medications:  Current Outpatient Medications:    amoxicillin (AMOXIL) 875 MG tablet, Take 1 tablet (875 mg total) by mouth 2 (two) times daily for 10 days., Disp: 20 tablet, Rfl: 0   acyclovir ointment (ZOVIRAX) 5 %, Apply topically every 3 hours., Disp: 15 g, Rfl: 11   amLODipine (NORVASC) 5 MG tablet, Take  1 tablet (5 mg total) by mouth daily, Disp: 90 tablet, Rfl: 3   aspirin EC 81 MG tablet, Take 1 tablet (81 mg total) by mouth daily. Annual appt due in Oct must see provider for future refills, Disp: 90 tablet, Rfl: 3   atorvastatin (LIPITOR) 40 MG tablet, Take 1 tablet (40 mg total) by mouth daily., Disp: 90 tablet, Rfl: 3   buPROPion (WELLBUTRIN XL) 300 MG 24 hr tablet, Take 1 tablet (300 mg total) by mouth daily., Disp: 90 tablet, Rfl: 3   co-enzyme Q-10 30 MG capsule, Take 3 capsules (90 mg total) by mouth daily. Annual appt due in Oct must see provider for future refills, Disp: 270 capsule, Rfl: 3   diazepam (VALIUM) 5 MG tablet, Take 1 tablet by mouth every 12 hours as needed for anxiety., Disp: 15 tablet, Rfl: 0   escitalopram (LEXAPRO) 10 MG tablet, Take 1 tablet (10 mg total) by mouth daily., Disp: 90 tablet, Rfl: 3   ferrous sulfate 325 (65 FE) MG EC tablet, Take 1 tablet (325 mg total) by mouth daily with breakfast., Disp: 100 tablet, Rfl: 3   ibuprofen (ADVIL) 200 MG tablet, Take 600 mg by mouth every 6 (six) hours as needed for headache or moderate pain., Disp: , Rfl:    icosapent Ethyl (VASCEPA) 1 g capsule, Take 2 capsules by mouth 2 times daily., Disp: 120 capsule, Rfl: 11   ipratropium (ATROVENT) 0.03 % nasal spray, Place 2 sprays into both nostrils 4 (four) times daily as needed for rhinitis., Disp: 30 mL, Rfl: 12   lisinopril (ZESTRIL) 20 MG tablet, Take 2 tablets (40 mg total) by mouth daily., Disp: 180 tablet, Rfl: 3   loratadine (CLARITIN) 10 MG tablet, Take 10 mg by mouth daily., Disp: , Rfl:    metoprolol tartrate (LOPRESSOR) 25 MG tablet, Take 1 tablet (25 mg total) by mouth 2 (two) times daily., Disp: 180 tablet, Rfl: 3   mupirocin cream (BACTROBAN) 2 %, Apply 1 Application topically 2 (two) times daily., Disp: 15 g, Rfl: 0   Semaglutide-Weight Management 2.4 MG/0.75ML SOAJ, Inject 2.4 mg into the skin once a week., Disp: 9 mL, Rfl: 3   vitamin D3 (CHOLECALCIFEROL) 25 MCG  tablet, Take 1 tablet (1,000 Units total) by mouth daily. Annual appt due in Oct must see provider for future refills, Disp: 90 tablet, Rfl: 0  Observations/Objective: Patient is well-developed, well-nourished in no acute distress.  Resting comfortably  at home.  Head is normocephalic, atraumatic.  No labored breathing.  Speech is clear and coherent with logical content.  Patient is alert and oriented at baseline.    Assessment and Plan: 1. Viral URI  2. Dysfunction of left eustachian tube  3. Acute otitis media, unspecified otitis media type - amoxicillin (AMOXIL) 875 MG tablet; Take 1 tablet (875 mg total) by mouth 2 (two) times daily for 10 days.  Dispense: 20 tablet; Refill: 0  Supportive measures and OTC medications reviewed to help with symptom care for Viral URI. Will have  her continue nasal steroid spray and antihistamine for ETD, trying to avoid decongestants giving her cardiac history. Concern for secondary AOM so will add on Amox per orders. In person follow-up precautions reviewed.   Follow Up Instructions: I discussed the assessment and treatment plan with the patient. The patient was provided an opportunity to ask questions and all were answered. The patient agreed with the plan and demonstrated an understanding of the instructions.  A copy of instructions were sent to the patient via MyChart unless otherwise noted below.   The patient was advised to call back or seek an in-person evaluation if the symptoms worsen or if the condition fails to improve as anticipated.    Tricia Climes, PA-C

## 2023-05-24 ENCOUNTER — Telehealth: Payer: 59 | Admitting: Internal Medicine

## 2023-05-25 ENCOUNTER — Other Ambulatory Visit: Payer: Self-pay

## 2023-05-25 ENCOUNTER — Ambulatory Visit: Payer: 59 | Admitting: Gastroenterology

## 2023-05-25 ENCOUNTER — Encounter: Payer: Self-pay | Admitting: Gastroenterology

## 2023-05-25 VITALS — BP 132/86 | HR 89 | Ht 66.0 in | Wt 204.4 lb

## 2023-05-25 DIAGNOSIS — Z800 Family history of malignant neoplasm of digestive organs: Secondary | ICD-10-CM

## 2023-05-25 DIAGNOSIS — K5900 Constipation, unspecified: Secondary | ICD-10-CM

## 2023-05-25 DIAGNOSIS — K644 Residual hemorrhoidal skin tags: Secondary | ICD-10-CM

## 2023-05-25 DIAGNOSIS — K649 Unspecified hemorrhoids: Secondary | ICD-10-CM

## 2023-05-25 MED ORDER — HYDROCORTISONE (PERIANAL) 2.5 % EX CREA
1.0000 | TOPICAL_CREAM | Freq: Two times a day (BID) | CUTANEOUS | 0 refills | Status: AC
  Filled 2023-05-25: qty 30, 10d supply, fill #0

## 2023-05-25 NOTE — Patient Instructions (Addendum)
We have sent the following medications to your pharmacy for you to pick up at your convenience: Hydrocortisone cream 2.5 %: use twice daily for 14 days.  We are referring you to [facility] re: hemorrhoids.  They will contact you directly to schedule an appointment.  It may take a week or more before you hear from them.  Please feel free to contact us if you have not heard from them within 2 weeks and we will follow up on the referral.    We will request your colonoscopy records from 2021, [facility]  Thank you for trusting me with your gastrointestinal care!   Boone Master, PA    If your blood pressure at your visit was 140/90 or greater, please contact your primary care physician to follow up on this. ______________________________________________________  If you are age 60 or older, your body mass index should be between 23-30. Your Body mass index is 32.99 kg/m. If this is out of the aforementioned range listed, please consider follow up with your Primary Care Provider.  If you are age 60 or younger, your body mass index should be between 19-25. Your Body mass index is 32.99 kg/m. If this is out of the aformentioned range listed, please consider follow up with your Primary Care Provider.  ________________________________________________________  The [facility] providers would like to encourage you to use [facility] to communicate with providers for non-urgent requests or questions.  Due to long hold times on the telephone, sending your provider a message by [facility] may be a faster and more efficient way to get a response.  Please allow 48 business hours for a response.  Please remember that this is for non-urgent requests.  _______________________________________________________  Due to recent changes in healthcare laws, you may see the results of your imaging and laboratory studies on MyChart before your provider has had a chance to review them.  We understand that in  some cases there may be results that are confusing or concerning to you. Not all laboratory results come back in the same time frame and the provider may be waiting for multiple results in order to interpret others.  Please give [place] 48 hours in order for your provider to thoroughly review all the results before contacting the office for clarification of your results.

## 2023-05-25 NOTE — Progress Notes (Signed)
Chief Complaint: Hemorrhoids Primary GI MD: Gentry Fitz  HPI: 62 year old female history of factor V Leiden, CAD s/p CABG 2019, obesity, anxiety, depression, gastric bypass, hypertension, presents for evaluation of hemorrhoids.  Recent visit to the emergency department 02/14/2023 for chest pain.  History of CAD in 2019.  Negative troponins.  Labs were normal.  EKG showed normal sinus rhythm.  Patient was set to follow-up with PCP. She also follows with cardiology regularly (Dr. Tollie Pizza). 2023 stress test without evidence of ischemia or infarction. Takes ASA but no blood thinner. She thinks this chest pain was because it was the 5 year anniversary of her bypass and it caused her stress.  Patient reported longstanding history of hemorrhoids ongoing since she was 23 in which sometimes when she has a flare she has to use ibuprofen and typically cannot sit down.  Was told in [place] a while back she had prolapsed internal hemorrhoids.  Discussed the use of AI scribe software for clinical note transcription with the patient, who gave verbal consent to proceed.  The patient, with a history of hemorrhoids since the age of 28, presents with worsening symptoms that she describes as "angry hemorrhoids." The hemorrhoids are primarily external, but the patient is unsure if they originated internally. The patient's symptoms were exacerbated by constipation induced by [facility], a medication she started last year. The constipation has since improved with the use of Colace as needed, increased water intake, and a diet rich in fruits and vegetables. Despite these measures, the patient has been unable to achieve satisfactory control of her hemorrhoids.  The patient's hemorrhoids are primarily painful, with no mention of bleeding. Over-the-counter treatments, including Preparation H with lidocaine and ibuprofen, have been used with some success. The patient also uses bidets and wipes for hygiene purposes.  The  patient has a family history of colon cancer, with her mother diagnosed at the age of 30. She has been diligent about colonoscopies, with the most recent one conducted in August 2021 at [facility]. The colonoscopy revealed diverticulosis but no polyps. The patient is due for her next colonoscopy in August 2025. We will obtain these records.  PREVIOUS GI WORKUP   Echocardiogram May 2023 with normal ejection fraction  Past Medical History:  Diagnosis Date   Chicken pox    Diverticulosis    Gallstones    Gestational diabetes    Heart disease    Hyperlipidemia    Hypertension    Kidney stone     Past Surgical History:  Procedure Laterality Date   CESAREAN SECTION     CHOLECYSTECTOMY     CORONARY ARTERY BYPASS GRAFT  2019   GASTRIC BYPASS  2005   Lasix Eye      Current Outpatient Medications  Medication Sig Dispense Refill   acyclovir ointment (ZOVIRAX) 5 % Apply topically every 3 hours. (Patient taking differently: Apply 1 Application topically as needed.) 15 g 11   amLODipine (NORVASC) 5 MG tablet Take 1 tablet (5 mg total) by mouth daily 90 tablet 3   amoxicillin (AMOXIL) 875 MG tablet Take 1 tablet (875 mg total) by mouth 2 (two) times daily for 10 days. 20 tablet 0   aspirin EC 81 MG tablet Take 1 tablet (81 mg total) by mouth daily. Annual appt due in Oct must see provider for future refills 90 tablet 3   atorvastatin (LIPITOR) 40 MG tablet Take 1 tablet (40 mg total) by mouth daily. 90 tablet 3   buPROPion (WELLBUTRIN XL) 300 MG  24 hr tablet Take 1 tablet (300 mg total) by mouth daily. 90 tablet 3   co-enzyme Q-10 30 MG capsule Take 3 capsules (90 mg total) by mouth daily. Annual appt due in Oct must see provider for future refills 270 capsule 3   diazepam (VALIUM) 5 MG tablet Take 1 tablet by mouth every 12 hours as needed for anxiety. 15 tablet 0   escitalopram (LEXAPRO) 10 MG tablet Take 1 tablet (10 mg total) by mouth daily. 90 tablet 3   ferrous sulfate 325 (65 FE) MG EC  tablet Take 1 tablet (325 mg total) by mouth daily with breakfast. 100 tablet 3   hydrocortisone (ANUSOL-HC) 2.5 % rectal cream Place 1 Application rectally 2 (two) times daily for 14 days. 30 g 0   ibuprofen (ADVIL) 200 MG tablet Take 600 mg by mouth every 6 (six) hours as needed for headache or moderate pain.     icosapent Ethyl (VASCEPA) 1 g capsule Take 2 capsules by mouth 2 times daily. 120 capsule 11   ipratropium (ATROVENT) 0.03 % nasal spray Place 2 sprays into both nostrils 4 (four) times daily as needed for rhinitis. 30 mL 12   lisinopril (ZESTRIL) 20 MG tablet Take 2 tablets (40 mg total) by mouth daily. 180 tablet 3   loratadine (CLARITIN) 10 MG tablet Take 10 mg by mouth daily as needed.     mupirocin cream (BACTROBAN) 2 % Apply 1 Application topically 2 (two) times daily. (Patient taking differently: Apply 1 Application topically 2 (two) times daily as needed.) 15 g 0   Semaglutide-Weight Management 2.4 MG/0.75ML SOAJ Inject 2.4 mg into the skin once a week. (Patient taking differently: Inject 2.4 mg into the skin once a week. WEGOVY) 9 mL 3   vitamin D3 (CHOLECALCIFEROL) 25 MCG tablet Take 1 tablet (1,000 Units total) by mouth daily. Annual appt due in Oct must see provider for future refills 90 tablet 0   No current facility-administered medications for this visit.    Allergies as of 05/25/2023 - Review Complete 05/25/2023  Allergen Reaction Noted   Enoxaparin Other (See Comments) 05/10/2018   Heparin (porcine) Other (See Comments) 03/23/2010   Other Anaphylaxis 04/11/2021   Rosuvastatin  03/01/2023   Sulfa antibiotics Nausea And Vomiting 03/19/2010   Sulfamethoxazole Nausea And Vomiting 05/10/2018    Family History  Problem Relation Age of Onset   Colon cancer Mother 27 - 27   Heart attack Father 93   Diabetes Sister    Heart attack Brother    Diabetes Brother     Social History   Socioeconomic History   Marital status: Married    Spouse name: Not on file    Number of children: Not on file   Years of education: Not on file   Highest education level: Not on file  Occupational History   Not on file  Tobacco Use   Smoking status: Former    Types: Cigarettes   Smokeless tobacco: Never  Vaping Use   Vaping status: Never Used  Substance and Sexual Activity   Alcohol use: Yes    Alcohol/week: 8.0 standard drinks of alcohol    Types: 8 Glasses of wine per week    Comment: 3 to 4 wine per week   Drug use: Never   Sexual activity: Yes    Partners: Male  Other Topics Concern   Not on file  Social History Narrative   Not on file   Social Determinants of Health   Financial  Resource Strain: Not on file  Food Insecurity: Not on file  Transportation Needs: Not on file  Physical Activity: Not on file  Stress: Not on file  Social Connections: Not on file  Intimate Partner Violence: Not on file    Review of Systems:    Constitutional: No weight loss, fever, chills, weakness or fatigue HEENT: Eyes: No change in vision               Ears, Nose, Throat:  No change in hearing or congestion Skin: No rash or itching Cardiovascular: No chest pain, chest pressure or palpitations   Respiratory: No SOB or cough Gastrointestinal: See HPI and otherwise negative Genitourinary: No dysuria or change in urinary frequency Neurological: No headache, dizziness or syncope Musculoskeletal: No new muscle or joint pain Hematologic: No bleeding or bruising Psychiatric: No history of depression or anxiety    Physical Exam:  Vital signs: BP 132/86   Pulse 89   Ht 5\' 6"  (1.676 m)   Wt 204 lb 6.4 oz (92.7 kg)   SpO2 96%   BMI 32.99 kg/m   Constitutional: NAD, Well developed, Well nourished, alert and cooperative Head:  Normocephalic and atraumatic. Eyes:   PEERL, EOMI. No icterus. Conjunctiva pink. Respiratory: Respirations even and unlabored. Lungs clear to auscultation bilaterally.   No wheezes, crackles, or rhonchi.  Cardiovascular:  Regular rate and  rhythm. No peripheral edema, cyanosis or pallor.  Gastrointestinal:  Soft, nondistended, nontender. No rebound or guarding. Normal bowel sounds. No appreciable masses or hepatomegaly. Rectal:  Brooke CMA chaperone. Multiple external hemorrhoids, nonthrombosed, nontender. Normal sphincter tone. Brown stool in vault. No internal hemorrhoids appreciated. Msk:  Symmetrical without gross deformities. Without edema, no deformity or joint abnormality.  Neurologic:  Alert and  oriented x4;  grossly normal neurologically.  Skin:   Dry and intact without significant lesions or rashes. Psychiatric: Oriented to person, place and time. Demonstrates good judgement and reason without abnormal affect or behaviors.    RELEVANT LABS AND IMAGING: CBC    Component Value Date/Time   WBC 7.8 02/14/2023 1109   RBC 4.71 02/14/2023 1109   HGB 13.9 02/14/2023 1109   HGB WILL FOLLOW 01/26/2023 1451   HCT 42.5 02/14/2023 1109   HCT WILL FOLLOW 01/26/2023 1451   PLT 226 02/14/2023 1109   PLT WILL FOLLOW 01/26/2023 1451   MCV 90.2 02/14/2023 1109   MCV WILL FOLLOW 01/26/2023 1451   MCH 29.5 02/14/2023 1109   MCHC 32.7 02/14/2023 1109   RDW 14.3 02/14/2023 1109   RDW WILL FOLLOW 01/26/2023 1451   LYMPHSABS WILL FOLLOW 01/26/2023 1451   EOSABS WILL FOLLOW 01/26/2023 1451   BASOSABS WILL FOLLOW 01/26/2023 1451    CMP     Component Value Date/Time   NA 135 02/14/2023 1109   NA 140 01/26/2023 1451   K 4.2 02/14/2023 1109   CL 101 02/14/2023 1109   CO2 24 02/14/2023 1109   GLUCOSE 114 (H) 02/14/2023 1109   BUN 9 02/14/2023 1109   BUN 12 01/26/2023 1451   CREATININE 0.82 02/14/2023 1109   CALCIUM 8.8 (L) 02/14/2023 1109   PROT 6.7 01/26/2023 1451   ALBUMIN 4.2 01/26/2023 1451   AST 22 01/26/2023 1451   ALT 27 01/26/2023 1451   ALKPHOS 132 (H) 01/26/2023 1451   BILITOT 0.7 01/26/2023 1451   GFRNONAA >60 02/14/2023 1109     Assessment/Plan:      External Hemorrhoids Chronic, painful external  hemorrhoids, not thrombosed. Patient has tried over-the-counter treatments  including Preparation H with lidocaine and ibuprofen with limited success. -Prescribe prescription strength Hydrocortisone cream 2.5% to be used twice daily for 14 days. -Refer to General Surgery for further management options. - discussed Sitz baths, increase fiber, increase water, and control of constipation  Colon Cancer Screening Last colonoscopy in August 2021 with no polyps found. Family history of colon cancer (mother). -Next colonoscopy due in August 2025. - obtain previous records from [facility]  Patient was assigned to Dr. Chales Abrahams today  Boone Master, PA-C [facility] 05/25/2023, 12:27 PM  Cc: Myrlene Broker, *

## 2023-05-31 ENCOUNTER — Other Ambulatory Visit: Payer: Self-pay

## 2023-06-02 NOTE — Addendum Note (Signed)
Addended by: Waldon Merl on: 06/02/2023 06:50 AM   Modules accepted: Level of Service

## 2023-06-05 NOTE — Progress Notes (Signed)
Agree with assessment/plan.  Raj Florestine Carmical, MD [facility] [phone]  

## 2023-06-08 ENCOUNTER — Encounter: Payer: Self-pay | Admitting: Obstetrics and Gynecology

## 2023-06-08 ENCOUNTER — Ambulatory Visit (INDEPENDENT_AMBULATORY_CARE_PROVIDER_SITE_OTHER): Payer: 59 | Admitting: Obstetrics and Gynecology

## 2023-06-08 VITALS — BP 121/76 | HR 85 | Ht 66.0 in | Wt 201.0 lb

## 2023-06-08 DIAGNOSIS — Z01419 Encounter for gynecological examination (general) (routine) without abnormal findings: Secondary | ICD-10-CM

## 2023-06-08 NOTE — Progress Notes (Signed)
Subjective:     Tricia King is a 62 y.o. female postmenopausal with BMI 32 who is here for a comprehensive physical exam. The patient reports no problems. She denies any episodes of postmenopausal vaginal bleeding. She denies any pelvic pain or abnormal discharge. Patient is sexually active without complaints. She denies any urinary incontinence or issues with bowel movement  Past Medical History:  Diagnosis Date   Chicken pox    Diverticulosis    Gallstones    Gestational diabetes    Heart disease    Hyperlipidemia    Hypertension    Kidney stone    Past Surgical History:  Procedure Laterality Date   CESAREAN SECTION     CHOLECYSTECTOMY     CORONARY ARTERY BYPASS GRAFT  2019   GASTRIC BYPASS  2005   Lasix Eye     Family History  Problem Relation Age of Onset   Colon cancer Mother 67 - 60   Heart attack Father 78   Diabetes Sister    Heart attack Brother    Diabetes Brother     Social History   Socioeconomic History   Marital status: Married    Spouse name: Not on file   Number of children: Not on file   Years of education: Not on file   Highest education level: Not on file  Occupational History   Not on file  Tobacco Use   Smoking status: Former    Types: Cigarettes   Smokeless tobacco: Never  Vaping Use   Vaping status: Never Used  Substance and Sexual Activity   Alcohol use: Yes    Alcohol/week: 8.0 standard drinks of alcohol    Types: 8 Glasses of wine per week    Comment: 3 to 4 wine per week   Drug use: Never   Sexual activity: Yes    Partners: Male  Other Topics Concern   Not on file  Social History Narrative   Not on file   Social Drivers of Health   Financial Resource Strain: Not on file  Food Insecurity: Not on file  Transportation Needs: Not on file  Physical Activity: Not on file  Stress: Not on file  Social Connections: Not on file  Intimate Partner Violence: Not on file   Health Maintenance  Topic Date Due   HIV Screening  Never  done   Hepatitis C Screening  Never done   DTaP/Tdap/Td (1 - Tdap) Never done   Zoster Vaccines- Shingrix (2 of 2) 05/07/2021   COVID-19 Vaccine (5 - 2024-25 season) 02/20/2023   MAMMOGRAM  02/19/2024   Cervical Cancer Screening (HPV/Pap Cotest)  02/16/2027   Colonoscopy  01/22/2029   INFLUENZA VACCINE  Completed   HPV VACCINES  Aged Out       Review of Systems Pertinent items noted in HPI and remainder of comprehensive ROS otherwise negative.   Objective:  Blood pressure 121/76, pulse 85, height 5\' 6"  (1.676 m), weight 201 lb (91.2 kg).   GENERAL: Well-developed, well-nourished female in no acute distress.  HEENT: Normocephalic, atraumatic. Sclerae anicteric.  NECK: Supple. Normal thyroid.  LUNGS: Clear to auscultation bilaterally.  HEART: Regular rate and rhythm. BREASTS: Symmetric in size. No palpable masses or lymphadenopathy, skin changes, or nipple drainage. ABDOMEN: Soft, nontender, nondistended. No organomegaly. PELVIC: declined by patient EXTREMITIES: No cyanosis, clubbing, or edema, 2+ distal pulses.     Assessment:    Healthy female exam.      Plan:    Pap smear normal 01/2022 Screening  mammogram ordered Patient is current on colonoscopy Patient will be contacted with abnormal results Patient reports health maintenance labs done with PCP See After Visit Summary for Counseling Recommendations

## 2023-06-08 NOTE — Progress Notes (Signed)
Pt presents for annual. Last pap 02/15/22- normal. Due for mammogram. Declines STD screen.

## 2023-06-29 ENCOUNTER — Inpatient Hospital Stay (HOSPITAL_BASED_OUTPATIENT_CLINIC_OR_DEPARTMENT_OTHER): Admission: RE | Admit: 2023-06-29 | Payer: 59 | Source: Ambulatory Visit | Admitting: Radiology

## 2023-07-13 ENCOUNTER — Other Ambulatory Visit (HOSPITAL_COMMUNITY): Payer: Self-pay

## 2023-07-13 ENCOUNTER — Other Ambulatory Visit: Payer: Self-pay | Admitting: Internal Medicine

## 2023-07-14 ENCOUNTER — Other Ambulatory Visit: Payer: Self-pay

## 2023-07-14 ENCOUNTER — Other Ambulatory Visit (HOSPITAL_COMMUNITY): Payer: Self-pay

## 2023-07-18 ENCOUNTER — Other Ambulatory Visit: Payer: Self-pay

## 2023-07-18 MED ORDER — DIAZEPAM 5 MG PO TABS
5.0000 mg | ORAL_TABLET | Freq: Two times a day (BID) | ORAL | 0 refills | Status: DC | PRN
  Filled 2023-07-18: qty 15, 8d supply, fill #0

## 2023-07-26 ENCOUNTER — Telehealth: Payer: Self-pay

## 2023-07-26 NOTE — Telephone Encounter (Signed)
 Called [facility]- unable to leave a message for Medical records.  Request for colonoscopy in approximately 2021 has been faxed multiple times to [phone] with fax confirmation rec'd.   Called patient and LM that we have been unable to get the report and asked if she could get them to send it to her and she could provide us  with a copy. Fax number provided or could send thru MyChart.

## 2023-08-03 ENCOUNTER — Ambulatory Visit: Payer: Commercial Managed Care - PPO | Admitting: Internal Medicine

## 2023-08-17 ENCOUNTER — Other Ambulatory Visit: Payer: Self-pay

## 2023-08-17 ENCOUNTER — Other Ambulatory Visit (HOSPITAL_COMMUNITY): Payer: Self-pay

## 2023-08-17 ENCOUNTER — Encounter: Payer: Self-pay | Admitting: Internal Medicine

## 2023-08-17 ENCOUNTER — Ambulatory Visit: Payer: Commercial Managed Care - PPO | Admitting: Internal Medicine

## 2023-08-17 VITALS — BP 120/84 | HR 76 | Temp 98.3°F | Ht 66.0 in | Wt 207.0 lb

## 2023-08-17 DIAGNOSIS — H9202 Otalgia, left ear: Secondary | ICD-10-CM | POA: Insufficient documentation

## 2023-08-17 MED ORDER — NEOMYCIN-POLYMYXIN-HC 1 % OT SOLN
3.0000 [drp] | Freq: Three times a day (TID) | OTIC | 6 refills | Status: AC
  Filled 2023-08-17: qty 10, 22d supply, fill #0
  Filled 2023-12-06: qty 10, 22d supply, fill #1

## 2023-08-17 NOTE — Patient Instructions (Signed)
 We have sent in the ear drops. Use 3 drops 3 times a day for 3 days then 3 drops daily ongoing. If going well after 2-4 weeks you can reduce to every other day.

## 2023-08-17 NOTE — Assessment & Plan Note (Signed)
 Suspect chronic process. Rx cortisporin ear drops to use 3 drops TID for 3 days then 3 drops daily for 1-2 months then decrease to every other day if able.

## 2023-08-17 NOTE — Progress Notes (Signed)
   Subjective:   Patient ID: Tricia King, female    DOB: [birth year], 63 y.o.   MRN: [id number]  HPI The patient is a 63 YO female coming in for left ear pain chronically. No hearing change. Denies drainage.   Review of Systems  Constitutional: Negative.   HENT:  Positive for ear pain.   Eyes: Negative.   Respiratory:  Negative for cough, chest tightness and shortness of breath.   Cardiovascular:  Negative for chest pain, palpitations and leg swelling.  Gastrointestinal:  Negative for abdominal distention, abdominal pain, constipation, diarrhea, nausea and vomiting.  Musculoskeletal: Negative.   Skin: Negative.   Neurological: Negative.   Psychiatric/Behavioral: Negative.      Objective:  Physical Exam Constitutional:      Appearance: She is well-developed.  HENT:     Head: Normocephalic and atraumatic.     Right Ear: Tympanic membrane and ear canal normal.     Ears:     Comments: Left TM with bulging, ear canal with redness and some chronic changes Cardiovascular:     Rate and Rhythm: Normal rate and regular rhythm.  Pulmonary:     Effort: Pulmonary effort is normal. No respiratory distress.     Breath sounds: Normal breath sounds. No wheezing or rales.  Abdominal:     General: Bowel sounds are normal. There is no distension.     Palpations: Abdomen is soft.     Tenderness: There is no abdominal tenderness. There is no rebound.  Musculoskeletal:     Cervical back: Normal range of motion.  Skin:    General: Skin is warm and dry.  Neurological:     Mental Status: She is alert and oriented to person, place, and time.     Coordination: Coordination normal.     Vitals:   08/17/23 0920  BP: 120/84  Pulse: 76  Temp: 98.3 F (36.8 C)  TempSrc: Temporal  SpO2: 96%  Weight: 207 lb (93.9 kg)  Height: 5\' 6"  (1.676 m)    Assessment & Plan:

## 2023-08-19 ENCOUNTER — Other Ambulatory Visit: Payer: Self-pay

## 2023-08-19 ENCOUNTER — Other Ambulatory Visit (HOSPITAL_COMMUNITY): Payer: Self-pay

## 2023-09-02 ENCOUNTER — Encounter (HOSPITAL_COMMUNITY): Payer: Self-pay | Admitting: Pharmacist

## 2023-09-02 ENCOUNTER — Other Ambulatory Visit (HOSPITAL_COMMUNITY): Payer: Self-pay

## 2023-09-12 ENCOUNTER — Other Ambulatory Visit: Payer: Self-pay

## 2023-09-12 ENCOUNTER — Other Ambulatory Visit (HOSPITAL_COMMUNITY): Payer: Self-pay

## 2023-09-15 ENCOUNTER — Other Ambulatory Visit: Payer: Self-pay

## 2023-09-15 ENCOUNTER — Other Ambulatory Visit (HOSPITAL_COMMUNITY): Payer: Self-pay

## 2023-09-22 ENCOUNTER — Encounter: Payer: Self-pay | Admitting: Internal Medicine

## 2023-09-26 NOTE — Telephone Encounter (Signed)
 Last labs from you were 2022.

## 2023-09-29 NOTE — Telephone Encounter (Signed)
Pharmacy has been added to patients chart

## 2023-10-05 ENCOUNTER — Other Ambulatory Visit: Payer: Self-pay | Admitting: Internal Medicine

## 2023-10-05 ENCOUNTER — Other Ambulatory Visit (HOSPITAL_COMMUNITY): Payer: Self-pay

## 2023-10-10 NOTE — Telephone Encounter (Signed)
 No

## 2023-10-12 ENCOUNTER — Other Ambulatory Visit: Payer: Self-pay

## 2023-10-12 ENCOUNTER — Other Ambulatory Visit (HOSPITAL_COMMUNITY): Payer: Self-pay

## 2023-10-12 MED ORDER — DIAZEPAM 5 MG PO TABS
5.0000 mg | ORAL_TABLET | Freq: Two times a day (BID) | ORAL | 0 refills | Status: DC | PRN
  Filled 2023-10-12: qty 15, 8d supply, fill #0

## 2023-10-12 NOTE — Telephone Encounter (Signed)
 I have contacted them twice and was unable to speak with  representative due to high call volume

## 2023-11-07 ENCOUNTER — Encounter: Payer: Self-pay | Admitting: Internal Medicine

## 2023-11-07 NOTE — Telephone Encounter (Signed)
 Ok to send in.

## 2023-11-21 ENCOUNTER — Other Ambulatory Visit (HOSPITAL_COMMUNITY): Payer: Self-pay

## 2023-11-23 NOTE — Telephone Encounter (Signed)
 Pt has picked up paper RX and it was faxed to [facility] pharmacy

## 2023-11-26 ENCOUNTER — Other Ambulatory Visit (HOSPITAL_COMMUNITY): Payer: Self-pay

## 2023-12-05 ENCOUNTER — Encounter: Payer: Self-pay | Admitting: Obstetrics and Gynecology

## 2023-12-05 ENCOUNTER — Other Ambulatory Visit: Payer: Self-pay | Admitting: Obstetrics and Gynecology

## 2023-12-05 DIAGNOSIS — Z1231 Encounter for screening mammogram for malignant neoplasm of breast: Secondary | ICD-10-CM

## 2023-12-06 ENCOUNTER — Other Ambulatory Visit: Payer: Self-pay

## 2023-12-06 ENCOUNTER — Other Ambulatory Visit: Payer: Self-pay | Admitting: Internal Medicine

## 2023-12-06 MED ORDER — AMLODIPINE BESYLATE 5 MG PO TABS
5.0000 mg | ORAL_TABLET | Freq: Every day | ORAL | 0 refills | Status: DC
  Filled 2023-12-06: qty 30, 30d supply, fill #0

## 2023-12-14 ENCOUNTER — Ambulatory Visit
Admission: RE | Admit: 2023-12-14 | Discharge: 2023-12-14 | Disposition: A | Discharge status: Home / Self Care | Source: Ambulatory Visit | Attending: Obstetrics and Gynecology | Admitting: Obstetrics and Gynecology

## 2023-12-14 DIAGNOSIS — Z1231 Encounter for screening mammogram for malignant neoplasm of breast: Secondary | ICD-10-CM | POA: Diagnosis not present

## 2023-12-19 ENCOUNTER — Ambulatory Visit: Payer: Self-pay | Admitting: Obstetrics & Gynecology

## 2023-12-21 ENCOUNTER — Other Ambulatory Visit: Payer: Self-pay

## 2023-12-21 ENCOUNTER — Other Ambulatory Visit (HOSPITAL_COMMUNITY): Payer: Self-pay

## 2023-12-21 ENCOUNTER — Other Ambulatory Visit: Payer: Self-pay | Admitting: Internal Medicine

## 2023-12-21 MED ORDER — AMLODIPINE BESYLATE 5 MG PO TABS
5.0000 mg | ORAL_TABLET | Freq: Every day | ORAL | 0 refills | Status: DC
  Filled 2023-12-21 – 2024-01-15 (×3): qty 15, 15d supply, fill #0

## 2023-12-27 ENCOUNTER — Other Ambulatory Visit: Payer: Self-pay

## 2023-12-27 ENCOUNTER — Other Ambulatory Visit (HOSPITAL_COMMUNITY): Payer: Self-pay

## 2023-12-27 MED ORDER — DIAZEPAM 5 MG PO TABS
5.0000 mg | ORAL_TABLET | Freq: Two times a day (BID) | ORAL | 0 refills | Status: DC | PRN
  Filled 2023-12-27: qty 15, 8d supply, fill #0

## 2024-01-10 ENCOUNTER — Other Ambulatory Visit (HOSPITAL_COMMUNITY): Payer: Self-pay

## 2024-01-10 ENCOUNTER — Other Ambulatory Visit: Payer: Self-pay

## 2024-01-13 ENCOUNTER — Other Ambulatory Visit: Payer: Self-pay

## 2024-01-14 ENCOUNTER — Other Ambulatory Visit (HOSPITAL_COMMUNITY): Payer: Self-pay

## 2024-01-16 ENCOUNTER — Other Ambulatory Visit: Payer: Self-pay

## 2024-01-16 ENCOUNTER — Other Ambulatory Visit (HOSPITAL_COMMUNITY): Payer: Self-pay

## 2024-01-23 ENCOUNTER — Encounter: Payer: Self-pay | Admitting: Internal Medicine

## 2024-02-01 ENCOUNTER — Ambulatory Visit: Attending: Cardiovascular Disease | Admitting: Physician Assistant

## 2024-02-01 ENCOUNTER — Encounter: Payer: Self-pay | Admitting: Physician Assistant

## 2024-02-01 VITALS — BP 118/74 | HR 75 | Ht 66.0 in | Wt 207.6 lb

## 2024-02-01 DIAGNOSIS — I25708 Atherosclerosis of coronary artery bypass graft(s), unspecified, with other forms of angina pectoris: Secondary | ICD-10-CM

## 2024-02-01 DIAGNOSIS — E785 Hyperlipidemia, unspecified: Secondary | ICD-10-CM | POA: Diagnosis not present

## 2024-02-01 DIAGNOSIS — I10 Essential (primary) hypertension: Secondary | ICD-10-CM

## 2024-02-01 NOTE — Progress Notes (Signed)
 Cardiology Office Note   Date:  02/03/2024  ID:  Valory Wetherby, DOB 1961/04/26, MRN [id number] PCP: Rollene [name], MD  [facility] Providers Cardiologist:  Lurena MARLA Red, MD     History of Present Illness Michiah Masse is a 63 y.o. female with past medical history of CAD s/p CABG with LIMA-LAD, left radial-OM, and SVG-PDA in 2019 at [facility], hypertension, hyperlipidemia, heterozygous factor V Leiden mutation and history of gastric bypass surgery.  Myoview  in May 2023 was low risk without ischemia or infarction, EF 66% at the time.  Echocardiogram in May 2023 showed EF 50 to 55%, grade 1 DD, mild MR, hypokinesis of LV basal inferior wall.  Patient was to establish with Dr. Red in February 2023.  He was continued on aspirin  and beta-blocker.  Imdur  and Protonix  were added for some atypical chest pain.  Patient was last seen by Artist Pouch in June 2024 at which time she was doing well without any recurrent chest discomfort.  She never started on the Imdur  or Protonix  because the symptom self improved.  She was also on not taking the metoprolol  either.  Her atorvastatin  40 mg was switched to Crestor  20 mg for better cholesterol control.  Lipid panel obtained on 01/26/2023 showed triglyceride 237, LDL 68.  Patient presents today for follow-up.  She is able to walk 20 minutes on the treadmill several days a week.  She denies any recent chest pain or shortness of breath.  She is due for CBC, CMP and fasting lipid panel.  If LDL is still greater than 55, I will increase rosuvastatin .  He was started on Vascepa  last year to help manage triglyceride.  Overall, she is doing very well and has no lower extremity edema, orthopnea or PND.  She can follow-up in 1 year.   ROS:   She denies chest pain, palpitations, dyspnea, pnd, orthopnea, n, v, dizziness, syncope, edema, weight gain, or early satiety. All other systems reviewed and are otherwise negative except as noted above.    Studies  Reviewed EKG Interpretation Date/Time:  Wednesday February 01 2024 14:57:55 EDT Ventricular Rate:  75 PR Interval:  198 QRS Duration:  82 QT Interval:  374 QTC Calculation: 417 R Axis:   -33  Text Interpretation: Normal sinus rhythm Left axis deviation Nonspecific T wave abnormality When compared with ECG of 14-Feb-2023 11:07, QRS axis Shifted left Criteria for Anterior infarct are no longer Present Nonspecific T wave abnormality, improved in Inferior leads Confirmed by Janene Boer [phone]) on 02/03/2024 10:47:45 PM    Cardiac Studies & Procedures   ______________________________________________________________________________________________   STRESS TESTS  MYOCARDIAL PERFUSION IMAGING 11/12/2021  Interpretation Summary   The study is normal. The study is low risk.   No ST deviation was noted.   LV perfusion is normal. There is no evidence of ischemia. There is no evidence of infarction.   Left ventricular function is normal. Nuclear stress EF: 66 %. The left ventricular ejection fraction is hyperdynamic (>65%). End diastolic cavity size is normal. End systolic cavity size is normal.   Prior study not available for comparison.   ECHOCARDIOGRAM  ECHOCARDIOGRAM COMPLETE 11/12/2021  Narrative ECHOCARDIOGRAM REPORT    Patient Name:   Francile Woolford  Date of Exam: 11/12/2021 Medical Rec #:  [id number]     Height:       66.0 in Accession #:    [id number]    Weight:       212.0 lb Date of Birth:  Dec 30, 1960  BSA:          2.050 m Patient Age:    61 years      BP:           118/80 mmHg Patient Gender: F             HR:           63 bpm. Exam Location:  [address]  Procedure: 2D Echo, 3D Echo, Cardiac Doppler and Color Doppler  Indications:    R07.9 Chest Pain  History:        Patient has no prior history of Echocardiogram examinations. CAD, Prior CABG, Signs/Symptoms:Chest Pain; Risk Factors:Hypertension, Dyslipidemia and Former Smoker.  Sonographer:    Heather Hawks  RDCS Referring Phys: ARUN K THUKKANI  IMPRESSIONS   1. Left ventricular ejection fraction, by estimation, is 50 to 55%. The left ventricle has low normal function. The left ventricle demonstrates regional wall motion abnormalities (see scoring diagram/findings for description). Left ventricular diastolic parameters are consistent with Grade I diastolic dysfunction (impaired relaxation). There is moderate hypokinesis of the left ventricular, basal inferior wall. 2. Right ventricular systolic function is mildly reduced. The right ventricular size is normal. Tricuspid regurgitation signal is inadequate for assessing PA pressure. 3. Left atrial size was mildly dilated. 4. The mitral valve is normal in structure. Mild mitral valve regurgitation. 5. The aortic valve is tricuspid. Aortic valve regurgitation is not visualized. No aortic stenosis is present.  FINDINGS Left Ventricle: Left ventricular ejection fraction, by estimation, is 50 to 55%. The left ventricle has low normal function. The left ventricle demonstrates regional wall motion abnormalities. Moderate hypokinesis of the left ventricular, basal inferior wall. 3D left ventricular ejection fraction analysis performed but not reported based on interpreter judgement due to suboptimal tracking. The left ventricular internal cavity size was small. There is no left ventricular hypertrophy. Abnormal (paradoxical) septal motion consistent with post-operative status. Left ventricular diastolic parameters are consistent with Grade I diastolic dysfunction (impaired relaxation). Indeterminate filling pressures.  Right Ventricle: The right ventricular size is normal. No increase in right ventricular wall thickness. Right ventricular systolic function is mildly reduced. Tricuspid regurgitation signal is inadequate for assessing PA pressure.  Left [facility]: Left atrial size was mildly dilated.  Right [facility]: Right atrial size was normal in  size.  Pericardium: There is no evidence of pericardial effusion.  Mitral Valve: The mitral valve is normal in structure. Mild mitral valve regurgitation.  Tricuspid Valve: The tricuspid valve is normal in structure. Tricuspid valve regurgitation is trivial.  Aortic Valve: The aortic valve is tricuspid. Aortic valve regurgitation is not visualized. No aortic stenosis is present.  Pulmonic Valve: The pulmonic valve was grossly normal. Pulmonic valve regurgitation is trivial.  Aorta: The aortic root and ascending aorta are structurally normal, with no evidence of dilitation.  IAS/Shunts: No atrial level shunt detected by color flow Doppler.   LEFT VENTRICLE PLAX 2D LVIDd:         4.30 cm   Diastology LVIDs:         3.20 cm   LV e' medial:    5.87 cm/s LV PW:         0.80 cm   LV E/e' medial:  16.0 LV IVS:        0.80 cm   LV e' lateral:   10.30 cm/s LVOT diam:     2.10 cm   LV E/e' lateral: 9.1 LV SV:         69 LV SV  Index:   33 LVOT Area:     3.46 cm  3D Volume EF: 3D EF:        72 % LV EDV:       157 ml LV ESV:       44 ml LV SV:        113 ml  RIGHT VENTRICLE RV Basal diam:  3.60 cm RV S prime:     7.62 cm/s TAPSE (M-mode): 0.9 cm  LEFT [facility]             Index        RIGHT [facility]           Index LA diam:        4.30 cm 2.10 cm/m   RA Area:     18.00 cm LA Vol (A2C):   59.3 ml 28.93 ml/m  RA Volume:   47.00 ml  22.93 ml/m LA Vol (A4C):   65.1 ml 31.76 ml/m LA Biplane Vol: 66.7 ml 32.54 ml/m AORTIC VALVE LVOT Vmax:   83.32 cm/s LVOT Vmean:  56.850 cm/s LVOT VTI:    0.198 m  AORTA Ao Root diam: 3.10 cm Ao Asc diam:  3.40 cm  MITRAL VALVE MV Area (PHT): cm          SHUNTS MV Decel Time: 243 msec     Systemic VTI:  0.20 m MV E velocity: 93.80 cm/s   Systemic Diam: 2.10 cm MV A velocity: 103.00 cm/s MV E/A ratio:  0.91  Mihai Croitoru MD Electronically signed by Jerel Balding MD Signature Date/Time: 11/12/2021/2:25:57 PM    Final           ______________________________________________________________________________________________      Risk Assessment/Calculations          Physical Exam VS:  BP 118/74   Pulse 75   Ht 5' 6 (1.676 m)   Wt 207 lb 9.6 oz (94.2 kg)   SpO2 97%   BMI 33.51 kg/m        Wt Readings from Last 3 Encounters:  02/01/24 207 lb 9.6 oz (94.2 kg)  08/17/23 207 lb (93.9 kg)  06/08/23 201 lb (91.2 kg)    GEN: Well nourished, well developed in no acute distress NECK: No JVD; No carotid bruits CARDIAC: RRR, no murmurs, rubs, gallops RESPIRATORY:  Clear to auscultation without rales, wheezing or rhonchi  ABDOMEN: Soft, non-tender, non-distended EXTREMITIES:  No edema; No deformity   ASSESSMENT AND PLAN  CAD s/p CABG in 2019: Denies any recent chest pain.  Patient is able to walk 20 minutes on the treadmill several times a week.  Hypertension: Blood pressure well-controlled  Hyperlipidemia: On atorvastatin  40 mg daily.  Obtain fasting lipid panel.       Dispo: Follow-up in 1 year  Signed, Phillippe Orlick, [place]

## 2024-02-01 NOTE — Patient Instructions (Signed)
 Medication Instructions:  No changes *If you need a refill on your cardiac medications before your next appointment, please call your pharmacy*  Lab Work: In a week we are going to draw a CBC, CMP, and fasting lipid panel If you have labs (blood work) drawn today and your tests are completely normal, you will receive your results only by: MyChart Message (if you have MyChart) OR A paper copy in the mail If you have any lab test that is abnormal or we need to change your treatment, we will call you to review the results.  Testing/Procedures: No testing  Follow-Up: At [facility], you and your health needs are our priority.  As part of our continuing mission to provide you with exceptional heart care, our providers are all part of one team.  This team includes your primary Cardiologist (physician) and Advanced Practice Providers or APPs (Physician Assistants and Nurse Practitioners) who all work together to provide you with the care you need, when you need it.  Your next appointment:   1 year(s)  Provider:   Arun K Thukkani, MD

## 2024-02-02 ENCOUNTER — Other Ambulatory Visit: Payer: Self-pay

## 2024-02-02 ENCOUNTER — Other Ambulatory Visit: Payer: Self-pay | Admitting: Internal Medicine

## 2024-02-02 ENCOUNTER — Other Ambulatory Visit (HOSPITAL_COMMUNITY): Payer: Self-pay

## 2024-02-02 MED ORDER — AMLODIPINE BESYLATE 5 MG PO TABS
5.0000 mg | ORAL_TABLET | Freq: Every day | ORAL | 1 refills | Status: DC
  Filled 2024-02-02: qty 90, 90d supply, fill #0
  Filled 2024-03-26 – 2024-04-25 (×2): qty 90, 90d supply, fill #1

## 2024-02-03 ENCOUNTER — Encounter: Payer: Self-pay | Admitting: Internal Medicine

## 2024-02-08 DIAGNOSIS — I25708 Atherosclerosis of coronary artery bypass graft(s), unspecified, with other forms of angina pectoris: Secondary | ICD-10-CM | POA: Diagnosis not present

## 2024-02-08 DIAGNOSIS — E785 Hyperlipidemia, unspecified: Secondary | ICD-10-CM | POA: Diagnosis not present

## 2024-02-08 DIAGNOSIS — I10 Essential (primary) hypertension: Secondary | ICD-10-CM | POA: Diagnosis not present

## 2024-02-08 LAB — LIPID PANEL

## 2024-02-09 ENCOUNTER — Ambulatory Visit: Payer: Self-pay | Admitting: Physician Assistant

## 2024-02-09 LAB — COMPREHENSIVE METABOLIC PANEL WITH GFR
ALT: 28 IU/L (ref 0–32)
AST: 22 IU/L (ref 0–40)
Albumin: 4.3 g/dL (ref 3.9–4.9)
Alkaline Phosphatase: 142 IU/L — AB (ref 44–121)
BUN/Creatinine Ratio: 10 — AB (ref 12–28)
BUN: 9 mg/dL (ref 8–27)
Bilirubin Total: 0.8 mg/dL (ref 0.0–1.2)
CO2: 23 mmol/L (ref 20–29)
Calcium: 9.3 mg/dL (ref 8.7–10.3)
Chloride: 103 mmol/L (ref 96–106)
Creatinine, Ser: 0.87 mg/dL (ref 0.57–1.00)
Globulin, Total: 2.7 g/dL (ref 1.5–4.5)
Glucose: 111 mg/dL — AB (ref 70–99)
Potassium: 4.6 mmol/L (ref 3.5–5.2)
Sodium: 141 mmol/L (ref 134–144)
Total Protein: 7 g/dL (ref 6.0–8.5)
eGFR: 76 mL/min/1.73 (ref >59)

## 2024-02-09 LAB — CBC
Hematocrit: 41.9 % (ref 34.0–46.6)
Hemoglobin: 13.6 g/dL (ref 11.1–15.9)
MCH: 29.2 pg (ref 26.6–33.0)
MCHC: 32.5 g/dL (ref 31.5–35.7)
MCV: 90 fL (ref 79–97)
Platelets: 265 x10E3/uL (ref 150–450)
RBC: 4.65 x10E6/uL (ref 3.77–5.28)
RDW: 13 % (ref 11.7–15.4)
WBC: 7.1 x10E3/uL (ref 3.4–10.8)

## 2024-02-09 LAB — LIPID PANEL
Cholesterol, Total: 117 mg/dL (ref 100–199)
HDL: 37 mg/dL — AB (ref >39)
LDL CALC COMMENT:: 3.2 ratio (ref 0.0–4.4)
LDL Chol Calc (NIH): 50 mg/dL (ref 0–99)
Triglycerides: 180 mg/dL — AB (ref 0–149)
VLDL Cholesterol Cal: 30 mg/dL (ref 5–40)

## 2024-02-13 ENCOUNTER — Other Ambulatory Visit: Payer: Self-pay | Admitting: Physician Assistant

## 2024-02-13 DIAGNOSIS — I25708 Atherosclerosis of coronary artery bypass graft(s), unspecified, with other forms of angina pectoris: Secondary | ICD-10-CM

## 2024-02-15 ENCOUNTER — Other Ambulatory Visit (HOSPITAL_COMMUNITY): Payer: Self-pay

## 2024-02-15 ENCOUNTER — Other Ambulatory Visit: Payer: Self-pay

## 2024-02-15 DIAGNOSIS — I25708 Atherosclerosis of coronary artery bypass graft(s), unspecified, with other forms of angina pectoris: Secondary | ICD-10-CM

## 2024-02-15 MED ORDER — ICOSAPENT ETHYL 1 G PO CAPS
2.0000 g | ORAL_CAPSULE | Freq: Two times a day (BID) | ORAL | 3 refills | Status: AC
  Filled 2024-02-15: qty 360, 90d supply, fill #0
  Filled 2024-05-27: qty 360, 90d supply, fill #1

## 2024-02-28 ENCOUNTER — Other Ambulatory Visit (HOSPITAL_COMMUNITY): Payer: Self-pay

## 2024-03-26 ENCOUNTER — Other Ambulatory Visit: Payer: Self-pay | Admitting: Internal Medicine

## 2024-03-27 ENCOUNTER — Other Ambulatory Visit: Payer: Self-pay

## 2024-03-27 ENCOUNTER — Other Ambulatory Visit (HOSPITAL_COMMUNITY): Payer: Self-pay

## 2024-03-31 ENCOUNTER — Other Ambulatory Visit (HOSPITAL_COMMUNITY): Payer: Self-pay

## 2024-03-31 MED ORDER — BUPROPION HCL ER (XL) 300 MG PO TB24
300.0000 mg | ORAL_TABLET | Freq: Every day | ORAL | 3 refills | Status: AC
  Filled 2024-03-31: qty 90, 90d supply, fill #0
  Filled 2024-05-27 – 2024-07-09 (×2): qty 90, 90d supply, fill #1

## 2024-03-31 MED ORDER — DIAZEPAM 5 MG PO TABS
5.0000 mg | ORAL_TABLET | Freq: Two times a day (BID) | ORAL | 0 refills | Status: DC | PRN
  Filled 2024-03-31: qty 15, 8d supply, fill #0

## 2024-04-02 ENCOUNTER — Other Ambulatory Visit: Payer: Self-pay

## 2024-04-03 ENCOUNTER — Other Ambulatory Visit: Payer: Self-pay

## 2024-04-25 ENCOUNTER — Other Ambulatory Visit (HOSPITAL_COMMUNITY): Payer: Self-pay

## 2024-05-27 ENCOUNTER — Other Ambulatory Visit: Payer: Self-pay | Admitting: Internal Medicine

## 2024-05-28 ENCOUNTER — Other Ambulatory Visit: Payer: Self-pay

## 2024-05-28 ENCOUNTER — Other Ambulatory Visit (HOSPITAL_COMMUNITY): Payer: Self-pay

## 2024-05-28 MED ORDER — LISINOPRIL 20 MG PO TABS
40.0000 mg | ORAL_TABLET | Freq: Every day | ORAL | 3 refills | Status: AC
  Filled 2024-05-28: qty 180, 90d supply, fill #0

## 2024-05-28 MED ORDER — ATORVASTATIN CALCIUM 40 MG PO TABS
40.0000 mg | ORAL_TABLET | Freq: Every day | ORAL | 3 refills | Status: AC
  Filled 2024-05-28: qty 90, 90d supply, fill #0

## 2024-05-28 MED ORDER — ESCITALOPRAM OXALATE 10 MG PO TABS
10.0000 mg | ORAL_TABLET | Freq: Every day | ORAL | 3 refills | Status: AC
  Filled 2024-05-28: qty 90, 90d supply, fill #0
  Filled 2024-07-01: qty 90, 90d supply, fill #1

## 2024-05-28 MED ORDER — DIAZEPAM 5 MG PO TABS
5.0000 mg | ORAL_TABLET | Freq: Two times a day (BID) | ORAL | 0 refills | Status: AC | PRN
  Filled 2024-05-28: qty 15, 8d supply, fill #0

## 2024-05-31 ENCOUNTER — Other Ambulatory Visit (HOSPITAL_COMMUNITY): Payer: Self-pay

## 2024-05-31 MED ORDER — AMLODIPINE BESYLATE 5 MG PO TABS
5.0000 mg | ORAL_TABLET | Freq: Every day | ORAL | 2 refills | Status: AC
  Filled 2024-05-31 – 2024-07-06 (×3): qty 90, 90d supply, fill #0

## 2024-07-02 ENCOUNTER — Other Ambulatory Visit: Payer: Self-pay

## 2024-07-02 ENCOUNTER — Other Ambulatory Visit (HOSPITAL_COMMUNITY): Payer: Self-pay

## 2024-07-06 ENCOUNTER — Other Ambulatory Visit (HOSPITAL_COMMUNITY): Payer: Self-pay

## 2024-07-06 ENCOUNTER — Other Ambulatory Visit: Payer: Self-pay

## 2024-07-09 ENCOUNTER — Encounter: Payer: Self-pay | Admitting: Internal Medicine

## 2024-07-09 ENCOUNTER — Other Ambulatory Visit: Payer: Self-pay

## 2024-07-11 ENCOUNTER — Encounter: Admitting: Internal Medicine

## 2024-07-18 ENCOUNTER — Ambulatory Visit (INDEPENDENT_AMBULATORY_CARE_PROVIDER_SITE_OTHER): Payer: Self-pay

## 2024-07-18 ENCOUNTER — Other Ambulatory Visit (HOSPITAL_COMMUNITY): Payer: Self-pay

## 2024-07-18 DIAGNOSIS — R238 Other skin changes: Secondary | ICD-10-CM

## 2024-07-18 MED ORDER — TRETINOIN 0.025 % EX CREA
TOPICAL_CREAM | Freq: Every day | CUTANEOUS | 4 refills | Status: AC
  Filled 2024-07-18: qty 45, 90d supply, fill #0

## 2024-07-18 NOTE — Progress Notes (Signed)
 NAMENykia King  MRN: [id number]  DOB: [birth year]    Referring physician:??Tricia [name], MD  PCP:?[name], [name], MD    CHIEF COMPLAINT:?Facial aesthetics    HPI:?  This is a 64 y.o. year old female who presents in consultation for facial aging.    Specifically, the patient is most bothered by the appearance of her dynamic and static rhytids.   Use of neurotoxin/fillers?  She has received Botox in the past without complications and has been very pleased with the outcome.   Patient denies h/o glaucoma, HTN, coagulopathies, or thyroid conditions. Not on any anticoagulation.     Family History:   Family history is negative for bleeding/clotting disorders, problems with anesthesia, connective tissue disorders.     Social History:   Social History   Socioeconomic History   Marital status: Married    Spouse name: Not on file   Number of children: Not on file   Years of education: Not on file   Highest education level: Not on file  Occupational History   Not on file  Tobacco Use   Smoking status: Former    Types: Cigarettes   Smokeless tobacco: Never  Vaping Use   Vaping status: Never Used  Substance and Sexual Activity   Alcohol use: Yes    Alcohol/week: 8.0 standard drinks of alcohol    Types: 8 Glasses of wine per week    Comment: 3 to 4 wine per week   Drug use: Never   Sexual activity: Yes    Partners: Male  Other Topics Concern   Not on file  Social History Narrative   Not on file   Social Drivers of Health   Tobacco Use: Medium Risk (02/01/2024)   Patient History    Smoking Tobacco Use: Former    Smokeless Tobacco Use: Never    Passive Exposure: Not on Actuary Strain: Not on file  Food Insecurity: Not on file  Transportation Needs: Not on file  Physical Activity: Not on file  Stress: Not on file  Social Connections: Not on file  Depression (PHQ2-9): Low Risk (04/07/2022)   Depression (PHQ2-9)    PHQ-2 Score: 0  Alcohol  Screen: Not on file  Housing: Not on file  Utilities: Not on file  Health Literacy: Not on file    Smoker/Vape Denies  Lives: Husband  Occupation: Works from home  Recreational Drug use: Denies   PMH:  Past Medical History:  Diagnosis Date   Chicken pox    Diverticulosis    Gallstones    Gestational diabetes    Heart disease    Hyperlipidemia    Hypertension    Kidney stone       PSH:  Past Surgical History:  Procedure Laterality Date   CESAREAN SECTION     CHOLECYSTECTOMY     CORONARY ARTERY BYPASS GRAFT  2019   GASTRIC BYPASS  2005   Lasix Eye        MEDICATIONS:?  Current Medications[1]    ALLERGIES:?  is allergic to enoxaparin, heparin (porcine), other, rosuvastatin , sulfa antibiotics, and sulfamethoxazole.    REVIEW OF SYSTEMS:  Review of Systems  ROS negative except as noted in HPI    VITALS:  MA as chaperone General: Well appearing, no apparent distress.  HEENT: Normocephalic, atraumatic, PERRL. Fitzpatrick 2. Skin quality is good. There are static and dynamic horizontal rhytids of the face, more pronounce in the periorbital region and perioral regions.   ASSESSMENT/PLAN  Assessment &  Plan     We discussed facial rejuvenation including lasers. The usual postoperative course including the 'rawness' of the skin and the need to keep it covered in ointment was stressed. Risks include scarring, pigment change and recalcitrant rhytids.    We also discussed ZO skin care and that this forms the basis of most aesthetic plans for facial rejuvenation. I encourage most patients to consider this as it is non-surgical and makes a big difference.   We also discussed Botox. I discussed with the patient this proposed procedure of botulinum toxin injections, which is customized depending on the particular needs of the patient. It is performed on facial rhytids as a temporary correction. The alternatives were discussed with the patient. The risks were addressed including  bleeding, scarring, infection, damage to deeper structures, asymmetry, and chronic pain, which may occur infrequently after a procedure. The individual's choice to undergo a surgical procedure is based on the comparison of risks to potential benefits. Other risks include unsatisfactory results, brow ptosis, eyelid ptosis, allergic reaction, temporary paralysis, which should go away with time, bruising, blurring disturbances and delayed healing. Botulinum toxin injections do not arrest the aging process or produce permanent tightening of the eyelid.  Operative intervention maybe necessary to maintain the results of a blepharoplasty or botulinum toxin.  The patient has a good understanding of all the risks and benefits, postoperative course and care. We obtained pictures and will give the patient quotes. All questions were answered.     The plan agreed upon includes: Botox injections, possible HALO laser, skin care routine and consultation with aesthetician today.  I spent 30 minutes counseling the patient and discussing plan of care.  Tricia King     [1]  Current Outpatient Medications:    tretinoin  (RETIN-A ) 0.025 % cream, Apply topically at bedtime., Disp: 45 g, Rfl: 4   amLODipine  (NORVASC ) 5 MG tablet, Take 1 tablet (5 mg total) by mouth daily., Disp: 90 tablet, Rfl: 2   aspirin  EC 81 MG tablet, Take 1 tablet (81 mg total) by mouth daily. Annual appt due in Oct must see provider for future refills, Disp: 90 tablet, Rfl: 3   atorvastatin  (LIPITOR) 40 MG tablet, Take 1 tablet (40 mg total) by mouth daily., Disp: 90 tablet, Rfl: 3   buPROPion  (WELLBUTRIN  XL) 300 MG 24 hr tablet, Take 1 tablet (300 mg total) by mouth daily., Disp: 90 tablet, Rfl: 3   co-enzyme Q-10 30 MG capsule, Take 3 capsules (90 mg total) by mouth daily. Annual appt due in Oct must see provider for future refills, Disp: 270 capsule, Rfl: 3   diazepam  (VALIUM ) 5 MG tablet, Take 1 tablet (5 mg total) by mouth every 12  (twelve) hours as needed for anxiety. Needs visit for future refills, Disp: 15 tablet, Rfl: 0   escitalopram  (LEXAPRO ) 10 MG tablet, Take 1 tablet (10 mg total) by mouth daily., Disp: 90 tablet, Rfl: 3   ferrous sulfate  325 (65 FE) MG EC tablet, Take 1 tablet (325 mg total) by mouth daily with breakfast., Disp: 100 tablet, Rfl: 3   ibuprofen (ADVIL) 200 MG tablet, Take 600 mg by mouth every 6 (six) hours as needed for headache or moderate pain., Disp: , Rfl:    icosapent  Ethyl (VASCEPA ) 1 g capsule, Take 2 capsules by mouth 2 times daily., Disp: 360 capsule, Rfl: 3   ipratropium (ATROVENT ) 0.03 % nasal spray, Place 2 sprays into both nostrils 4 (four) times daily as needed for rhinitis., Disp: 30  mL, Rfl: 12   lisinopril  (ZESTRIL ) 20 MG tablet, Take 2 tablets (40 mg total) by mouth daily., Disp: 180 tablet, Rfl: 3   loratadine (CLARITIN) 10 MG tablet, Take 10 mg by mouth daily as needed., Disp: , Rfl:    mupirocin  cream (BACTROBAN ) 2 %, Apply 1 Application topically 2 (two) times daily. (Patient taking differently: Apply 1 Application topically 2 (two) times daily as needed.), Disp: 15 g, Rfl: 0   NEOMYCIN -POLYMYXIN-HYDROCORTISONE  (CORTISPORIN ) 1 % SOLN OTIC solution, Place 3 drops into the left ear in the morning, at noon, and at bedtime., Disp: 10 mL, Rfl: 6   Semaglutide -Weight Management 2.4 MG/0.75ML SOAJ, Inject 2.4 mg into the skin once a week. (Patient taking differently: Inject 2.4 mg into the skin once a week. WEGOVY ), Disp: 9 mL, Rfl: 3   vitamin D3 (CHOLECALCIFEROL ) 25 MCG tablet, Take 1 tablet (1,000 Units total) by mouth daily. Annual appt due in Oct must see provider for future refills, Disp: 90 tablet, Rfl: 0

## 2024-07-26 ENCOUNTER — Telehealth: Payer: Self-pay | Admitting: Internal Medicine

## 2024-07-26 NOTE — Telephone Encounter (Signed)
 Patient dropped off document Accomodation Forms, to be filled out by provider. Patient requested to send it back via Fax within 7-days. Document is located in providers tray at front office.Please advise at [phone] (patient number) [phone] (spouse)

## 2024-07-26 NOTE — Telephone Encounter (Signed)
 Accommodation forms have been received and placed in provider's box (in office) awaiting signature.
